# Patient Record
Sex: Male | Born: 1980 | Race: White | Hispanic: No | Marital: Married | Smoking: Never smoker
Health system: Southern US, Community
[De-identification: ages and names within clinical notes are randomized; demographics above are authoritative.]

## PROBLEM LIST (undated history)

## (undated) DIAGNOSIS — R109 Unspecified abdominal pain: Secondary | ICD-10-CM

## (undated) DIAGNOSIS — B019 Varicella without complication: Secondary | ICD-10-CM

## (undated) DIAGNOSIS — Z9189 Other specified personal risk factors, not elsewhere classified: Secondary | ICD-10-CM

## (undated) HISTORY — DX: Varicella without complication: B01.9

## (undated) HISTORY — DX: Unspecified abdominal pain: R10.9

## (undated) HISTORY — PX: APPENDECTOMY: SHX54

## (undated) HISTORY — PX: CHOLECYSTECTOMY: SHX55

## (undated) HISTORY — PX: HERNIA MESH REMOVAL: SHX1745

## (undated) HISTORY — DX: Other specified personal risk factors, not elsewhere classified: Z91.89

---

## 2013-07-08 HISTORY — PX: VASECTOMY: SHX75

## 2014-06-17 ENCOUNTER — Ambulatory Visit (INDEPENDENT_AMBULATORY_CARE_PROVIDER_SITE_OTHER): Payer: BC Managed Care – PPO | Admitting: Medical

## 2014-06-17 ENCOUNTER — Encounter: Payer: Self-pay | Admitting: Medical

## 2014-06-17 VITALS — BP 137/80 | HR 58 | Temp 98.1°F | Wt 200.2 lb

## 2014-06-17 DIAGNOSIS — Z0000 Encounter for general adult medical examination without abnormal findings: Secondary | ICD-10-CM | POA: Insufficient documentation

## 2014-06-17 DIAGNOSIS — Z0189 Encounter for other specified special examinations: Secondary | ICD-10-CM

## 2014-06-17 NOTE — Progress Notes (Signed)
Subjective:    Patient ID: Jeffrey CoxJill Mckinney, male    DOB: 10/17/1980, 33 y.o.   MRN: [id number]  HPI   I have reviewed pt PMH, PSH, FH, Social History and Surgical History.  Pt works on generators, No exercise, maybe drinks a liter of coke a day, eats out a lot, married- 2 children., Non smoker and no alcohol.  Pt is not fasting.   Pt has negative review of systems except about 3 times a year after a meal he will feel very light headed and weak. Then he feels like may pass out and will have to lay down and then it will pass. So he never actually passes out. No seizure type activity. No chest pain that goes along with these episodes. Does  occasionally get heart burn but not with those weak episodes after meals.  Pt states sometimes with severe pain like if he hits his hand with hammer feels like he might pass out.  Mild nasal congested only today  Tdap in 2014.  Past Medical History  Diagnosis Date  . Chicken pox   . History of fainting spells of unknown cause     History   Social History  . Marital Status: Married    Spouse Name: N/A    Number of Children: N/A  . Years of Education: N/A   Occupational History  . Generator Tech    Social History Main Topics  . Smoking status: Never Smoker   . Smokeless tobacco: Not on file  . Alcohol Use: No  . Drug Use: No  . Sexual Activity:    Partners: Female   Other Topics Concern  . Not on file   Social History Narrative    Past Surgical History  Procedure Laterality Date  . Vasectomy  2015    Family History  Problem Relation Age of Onset  . Diabetes Paternal Grandmother   . Hypertension Father     Not on File  No current outpatient prescriptions on file prior to visit.   No current facility-administered medications on file prior to visit.    BP 137/80 mmHg  Pulse 58  Temp(Src) 98.1 F (36.7 C) (Oral)  Wt 200 lb 3.2 oz (90.81 kg)  SpO2 100%      Review of Systems  Constitutional: Negative for fever,  chills and fatigue.  HENT: Positive for congestion. Negative for ear discharge, ear pain, nosebleeds, postnasal drip, rhinorrhea, sinus pressure, sneezing, sore throat and trouble swallowing.   Respiratory: Negative for cough, chest tightness, shortness of breath and wheezing.   Cardiovascular: Negative for chest pain and palpitations.  Gastrointestinal: Negative for nausea, abdominal pain, diarrhea and rectal pain.       Rare heart burn none now.  Musculoskeletal: Negative for back pain.  Neurological: Positive for weakness. Negative for dizziness, tremors, seizures, syncope, facial asymmetry, light-headedness, numbness and headaches.       Rare occasional after eating large meals or if experiences pain.But no syncope.  Not now.  Hematological: Negative for adenopathy. Does not bruise/bleed easily.  Psychiatric/Behavioral: Negative for suicidal ideas, behavioral problems, self-injury and dysphoric mood. The patient is not nervous/anxious.        Objective:   Physical Exam   General Mental Status- Alert. Orientation- Oriented x3.  Build and Nutrition- Well nourished and Well Developed.  Skin General:-Normal. Color- Normal color. Moisture- Normal. Temperature-Warm.  HEENT  Ears- Normal. Auditory Canal- Bilateral-Normal. Tympanic Membrane- Bilateral-Normal. Eye Fundi-Bilateral-Normal. Pupil- bilateral- Direct reaction to light normal. Nose & Sinuses-  Normal. Nostrils-Bilateral- Normal. Mouth & Throat-Normal.  Neck Neck- No Bruits or Masses. Trachea midline.  Thyroid- Normal.  Chest and Lung Exam Percussion: Quality and Intensity-Percussion normal. Percussion of the chest reveals- No Dullness.  Palpation: Palpation of the chest reveals- Non-tender- No dullness. Auscultation: Breath Sounds- Normal.  Adventitous Sounds:-No adventitious sounds.  Cardiovascular Inspection:- No Heaves. Auscultation:-Normal sinus rhythm without murmur gallop, S1 WNL and S2  WNL.  Abdomen Inspection:-Inspection Normal. Inspection of the abdomen reveals- No hernias Palpation/Percussion:- Palpation and Percussion of the Abdomen reveal- Non Tender and No Palpable abdominal masses. Liver: Other Characteristics- No hepatomegaly. Spleen:Other Characteristics- No Splenomegaly. Auscultation:- Auscultation of the abdomen reveals- Bowel sounds normal and No Abdominal bruits.  Male Genitourinary Urethra:- No discharge. Penis- Circumcised. Scrotum- No masses. Testes- Bilateral-Normal.   Peripheral  Vascular Lower Extremity:Inspection- Bilateral-Inspection Normal  Palpation: Femoral pulse- Bilateral- 2+. Popliteal pulse- Bilateral-2+. Dorsalis pedis pulse- Bilateral- 1/2+. Edema- Bilateral- No edema.  Neurologic Mental Status:- Normal. Cranial Nerves:-Normal Bilaterally. Motor:-Normal. Strength:5/5 normal muscle strength-All Muscles. General Assessment of Reflexes: Right Knee-2+. Left Knee- 2+. Coordination-Normal. Gait- Normal.  Meningeal Signs- None.  Musculoskeletal Global Assessment General-Joints show full range of motion without obvious deformity and Normal muscle mass. Strength in upper and lower extremities.  Lymphatics General lymphatics Description- No generalized lymphadenopathy.        Assessment & Plan:

## 2014-06-17 NOTE — Progress Notes (Signed)
Pre visit review using our clinic review tool, if applicable. No additional management support is needed unless otherwise documented below in the visit note. 

## 2014-06-17 NOTE — Assessment & Plan Note (Signed)
For your physical exam today, I want you to get fasting labs within one week.(cmp, cbc, tsh, lipid panel)  I want you to cut back a lot on you soda consumption. Your weak sensation after meals may be related to high blood sugar.

## 2014-06-17 NOTE — Patient Instructions (Addendum)
For your physical exam today, I want you to get fasting labs within one week.  I want you to cut back a lot on you soda consumption. Your weak sensation after meals may be related to high blood sugar.  If you ever experience syncope, you need to be evaluated same day.  On ros you have mild cold/iri. Recommend continue mucinex. If worsens let us know.  Preventive Care for Adults A healthy lifestyle and preventive care can promote health and wellness. Preventive health guidelines for men include the following key practices:  A routine yearly physical is a good way to check with your health care provider about your health and preventative screening. It is a chance to share any concerns and updates on your health and to receive a thorough exam.  Visit your dentist for a routine exam and preventative care every 6 months. Brush your teeth twice a day and floss once a day. Good oral hygiene prevents tooth decay and gum disease.  The frequency of eye exams is based on your age, health, family medical history, use of contact lenses, and other factors. Follow your health care provider's recommendations for frequency of eye exams.  Eat a healthy diet. Foods such as vegetables, fruits, whole grains, low-fat dairy products, and lean protein foods contain the nutrients you need without too many calories. Decrease your intake of foods high in solid fats, added sugars, and salt. Eat the right amount of calories for you.Get information about a proper diet from your health care provider, if necessary.  Regular physical exercise is one of the most important things you can do for your health. Most adults should get at least 150 minutes of moderate-intensity exercise (any activity that increases your heart rate and causes you to sweat) each week. In addition, most adults need muscle-strengthening exercises on 2 or more days a week.  Maintain a healthy weight. The body mass index (BMI) is a screening tool to  identify possible weight problems. It provides an estimate of body fat based on height and weight. Your health care provider can find your BMI and can help you achieve or maintain a healthy weight.For adults 20 years and older:  A BMI below 18.5 is considered underweight.  A BMI of 18.5 to 24.9 is normal.  A BMI of 25 to 29.9 is considered overweight.  A BMI of 30 and above is considered obese.  Maintain normal blood lipids and cholesterol levels by exercising and minimizing your intake of saturated fat. Eat a balanced diet with plenty of fruit and vegetables. Blood tests for lipids and cholesterol should begin at age 82 and be repeated every 5 years. If your lipid or cholesterol levels are high, you are over 50, or you are at high risk for heart disease, you may need your cholesterol levels checked more frequently.Ongoing high lipid and cholesterol levels should be treated with medicines if diet and exercise are not working.  If you smoke, find out from your health care provider how to quit. If you do not use tobacco, do not start.  Lung cancer screening is recommended for adults aged 71-80 years who are at high risk for developing lung cancer because of a history of smoking. A yearly low-dose CT scan of the lungs is recommended for people who have at least a 30-pack-year history of smoking and are a current smoker or have quit within the past 15 years. A pack year of smoking is smoking an average of 1 pack of cigarettes a  day for 1 year (for example: 1 pack a day for 30 years or 2 packs a day for 15 years). Yearly screening should continue until the smoker has stopped smoking for at least 15 years. Yearly screening should be stopped for people who develop a health problem that would prevent them from having lung cancer treatment.  If you choose to drink alcohol, do not have more than 2 drinks per day. One drink is considered to be 12 ounces (355 mL) of beer, 5 ounces (148 mL) of wine, or 1.5  ounces (44 mL) of liquor.  Avoid use of street drugs. Do not share needles with anyone. Ask for help if you need support or instructions about stopping the use of drugs.  High blood pressure causes heart disease and increases the risk of stroke. Your blood pressure should be checked at least every 1-2 years. Ongoing high blood pressure should be treated with medicines, if weight loss and exercise are not effective.  If you are 80-6 years old, ask your health care provider if you should take aspirin to prevent heart disease.  Diabetes screening involves taking a blood sample to check your fasting blood sugar level. This should be done once every 3 years, after age 92, if you are within normal weight and without risk factors for diabetes. Testing should be considered at a younger age or be carried out more frequently if you are overweight and have at least 1 risk factor for diabetes.  Colorectal cancer can be detected and often prevented. Most routine colorectal cancer screening begins at the age of 69 and continues through age 34. However, your health care provider may recommend screening at an earlier age if you have risk factors for colon cancer. On a yearly basis, your health care provider may provide home test kits to check for hidden blood in the stool. Use of a small camera at the end of a tube to directly examine the colon (sigmoidoscopy or colonoscopy) can detect the earliest forms of colorectal cancer. Talk to your health care provider about this at age 76, when routine screening begins. Direct exam of the colon should be repeated every 5-10 years through age 21, unless early forms of precancerous polyps or small growths are found.  People who are at an increased risk for hepatitis B should be screened for this virus. You are considered at high risk for hepatitis B if:  You were born in a country where hepatitis B occurs often. Talk with your health care provider about which countries are  considered high risk.  Your parents were born in a high-risk country and you have not received a shot to protect against hepatitis B (hepatitis B vaccine).  You have HIV or AIDS.  You use needles to inject street drugs.  You live with, or have sex with, someone who has hepatitis B.  You are a man who has sex with other men (MSM).  You get hemodialysis treatment.  You take certain medicines for conditions such as cancer, organ transplantation, and autoimmune conditions.  Hepatitis C blood testing is recommended for all people born from [birth year] through 1965 and any individual with known risks for hepatitis C.  Practice safe sex. Use condoms and avoid high-risk sexual practices to reduce the spread of sexually transmitted infections (STIs). STIs include gonorrhea, chlamydia, syphilis, trichomonas, herpes, HPV, and human immunodeficiency virus (HIV). Herpes, HIV, and HPV are viral illnesses that have no cure. They can result in disability, cancer, and death.  If  you are at risk of being infected with HIV, it is recommended that you take a prescription medicine daily to prevent HIV infection. This is called preexposure prophylaxis (PrEP). You are considered at risk if:  You are a man who has sex with other men (MSM) and have other risk factors.  You are a heterosexual man, are sexually active, and are at increased risk for HIV infection.  You take drugs by injection.  You are sexually active with a partner who has HIV.  Talk with your health care provider about whether you are at high risk of being infected with HIV. If you choose to begin PrEP, you should first be tested for HIV. You should then be tested every 3 months for as long as you are taking PrEP.  A one-time screening for abdominal aortic aneurysm (AAA) and surgical repair of large AAAs by ultrasound are recommended for men ages 31 to 94 years who are current or former smokers.  Healthy men should no longer receive  prostate-specific antigen (PSA) blood tests as part of routine cancer screening. Talk with your health care provider about prostate cancer screening.  Testicular cancer screening is not recommended for adult males who have no symptoms. Screening includes self-exam, a health care provider exam, and other screening tests. Consult with your health care provider about any symptoms you have or any concerns you have about testicular cancer.  Use sunscreen. Apply sunscreen liberally and repeatedly throughout the day. You should seek shade when your shadow is shorter than you. Protect yourself by wearing long sleeves, pants, a wide-brimmed hat, and sunglasses year round, whenever you are outdoors.  Once a month, do a whole-body skin exam, using a mirror to look at the skin on your back. Tell your health care provider about new moles, moles that have irregular borders, moles that are larger than a pencil eraser, or moles that have changed in shape or color.  Stay current with required vaccines (immunizations).  Influenza vaccine. All adults should be immunized every year.  Tetanus, diphtheria, and acellular pertussis (Td, Tdap) vaccine. An adult who has not previously received Tdap or who does not know his vaccine status should receive 1 dose of Tdap. This initial dose should be followed by tetanus and diphtheria toxoids (Td) booster doses every 10 years. Adults with an unknown or incomplete history of completing a 3-dose immunization series with Td-containing vaccines should begin or complete a primary immunization series including a Tdap dose. Adults should receive a Td booster every 10 years.  Varicella vaccine. An adult without evidence of immunity to varicella should receive 2 doses or a second dose if he has previously received 1 dose.  Human papillomavirus (HPV) vaccine. Males aged 22-21 years who have not received the vaccine previously should receive the 3-dose series. Males aged 22-26 years may be  immunized. Immunization is recommended through the age of 71 years for any male who has sex with males and did not get any or all doses earlier. Immunization is recommended for any person with an immunocompromised condition through the age of 48 years if he did not get any or all doses earlier. During the 3-dose series, the second dose should be obtained 4-8 weeks after the first dose. The third dose should be obtained 24 weeks after the first dose and 16 weeks after the second dose.  Zoster vaccine. One dose is recommended for adults aged 20 years or older unless certain conditions are present.  Measles, mumps, and rubella (MMR) vaccine.  Adults born before [birth year] generally are considered immune to measles and mumps. Adults born in [birth year] or later should have 1 or more doses of MMR vaccine unless there is a contraindication to the vaccine or there is laboratory evidence of immunity to each of the three diseases. A routine second dose of MMR vaccine should be obtained at least 28 days after the first dose for students attending postsecondary schools, health care workers, or international travelers. People who received inactivated measles vaccine or an unknown type of measles vaccine during 1963-1967 should receive 2 doses of MMR vaccine. People who received inactivated mumps vaccine or an unknown type of mumps vaccine before 1979 and are at high risk for mumps infection should consider immunization with 2 doses of MMR vaccine. Unvaccinated health care workers born before [birth year] who lack laboratory evidence of measles, mumps, or rubella immunity or laboratory confirmation of disease should consider measles and mumps immunization with 2 doses of MMR vaccine or rubella immunization with 1 dose of MMR vaccine.  Pneumococcal 13-valent conjugate (PCV13) vaccine. When indicated, a person who is uncertain of his immunization history and has no record of immunization should receive the PCV13 vaccine. An adult aged 25 years or  older who has certain medical conditions and has not been previously immunized should receive 1 dose of PCV13 vaccine. This PCV13 should be followed with a dose of pneumococcal polysaccharide (PPSV23) vaccine. The PPSV23 vaccine dose should be obtained at least 8 weeks after the dose of PCV13 vaccine. An adult aged 52 years or older who has certain medical conditions and previously received 1 or more doses of PPSV23 vaccine should receive 1 dose of PCV13. The PCV13 vaccine dose should be obtained 1 or more years after the last PPSV23 vaccine dose.  Pneumococcal polysaccharide (PPSV23) vaccine. When PCV13 is also indicated, PCV13 should be obtained first. All adults aged 54 years and older should be immunized. An adult younger than age 66 years who has certain medical conditions should be immunized. Any person who resides in a nursing home or long-term care facility should be immunized. An adult smoker should be immunized. People with an immunocompromised condition and certain other conditions should receive both PCV13 and PPSV23 vaccines. People with human immunodeficiency virus (HIV) infection should be immunized as soon as possible after diagnosis. Immunization during chemotherapy or radiation therapy should be avoided. Routine use of PPSV23 vaccine is not recommended for American Indians, [place] Natives, or people younger than 65 years unless there are medical conditions that require PPSV23 vaccine. When indicated, people who have unknown immunization and have no record of immunization should receive PPSV23 vaccine. One-time revaccination 5 years after the first dose of PPSV23 is recommended for people aged 19-64 years who have chronic kidney failure, nephrotic syndrome, asplenia, or immunocompromised conditions. People who received 1-2 doses of PPSV23 before age 29 years should receive another dose of PPSV23 vaccine at age 35 years or later if at least 5 years have passed since the previous dose. Doses of  PPSV23 are not needed for people immunized with PPSV23 at or after age 45 years.  Meningococcal vaccine. Adults with asplenia or persistent complement component deficiencies should receive 2 doses of quadrivalent meningococcal conjugate (MenACWY-D) vaccine. The doses should be obtained at least 2 months apart. Microbiologists working with certain meningococcal bacteria, [employer] recruits, people at risk during an outbreak, and people who travel to or live in countries with a high rate of meningitis should be immunized. A first-year college student up through  age 69 years who is living in a residence hall should receive a dose if he did not receive a dose on or after his 16th birthday. Adults who have certain high-risk conditions should receive one or more doses of vaccine.  Hepatitis A vaccine. Adults who wish to be protected from this disease, have certain high-risk conditions, work with hepatitis A-infected animals, work in hepatitis A research labs, or travel to or work in countries with a high rate of hepatitis A should be immunized. Adults who were previously unvaccinated and who anticipate close contact with an international adoptee during the first 60 days after arrival in the [place] from a country with a high rate of hepatitis A should be immunized.  Hepatitis B vaccine. Adults should be immunized if they wish to be protected from this disease, have certain high-risk conditions, may be exposed to blood or other infectious body fluids, are household contacts or sex partners of hepatitis B positive people, are clients or workers in certain care facilities, or travel to or work in countries with a high rate of hepatitis B.  Haemophilus influenzae type b (Hib) vaccine. A previously unvaccinated person with asplenia or sickle cell disease or having a scheduled splenectomy should receive 1 dose of Hib vaccine. Regardless of previous immunization, a recipient of a hematopoietic stem cell transplant  should receive a 3-dose series 6-12 months after his successful transplant. Hib vaccine is not recommended for adults with HIV infection. Preventive Service / Frequency Ages 13 to 45  Blood pressure check.** / Every 1 to 2 years.  Lipid and cholesterol check.** / Every 5 years beginning at age 62.  Hepatitis C blood test.** / For any individual with known risks for hepatitis C.  Skin self-exam. / Monthly.  Influenza vaccine. / Every year.  Tetanus, diphtheria, and acellular pertussis (Tdap, Td) vaccine.** / Consult your health care provider. 1 dose of Td every 10 years.  Varicella vaccine.** / Consult your health care provider.  HPV vaccine. / 3 doses over 6 months, if 29 or younger.  Measles, mumps, rubella (MMR) vaccine.** / You need at least 1 dose of MMR if you were born in 1957 or later. You may also need a second dose.  Pneumococcal 13-valent conjugate (PCV13) vaccine.** / Consult your health care provider.  Pneumococcal polysaccharide (PPSV23) vaccine.** / 1 to 2 doses if you smoke cigarettes or if you have certain conditions.  Meningococcal vaccine.** / 1 dose if you are age 64 to 32 years and a Market researcher living in a residence hall, or have one of several medical conditions. You may also need additional booster doses.  Hepatitis A vaccine.** / Consult your health care provider.  Hepatitis B vaccine.** / Consult your health care provider.  Haemophilus influenzae type b (Hib) vaccine.** / Consult your health care provider. Ages 45 to 33  Blood pressure check.** / Every 1 to 2 years.  Lipid and cholesterol check.** / Every 5 years beginning at age 59.  Lung cancer screening. / Every year if you are aged 62-80 years and have a 30-pack-year history of smoking and currently smoke or have quit within the past 15 years. Yearly screening is stopped once you have quit smoking for at least 15 years or develop a health problem that would prevent you from having  lung cancer treatment.  Fecal occult blood test (FOBT) of stool. / Every year beginning at age 94 and continuing until age 40. You may not have to do this test  if you get a colonoscopy every 10 years.  Flexible sigmoidoscopy** or colonoscopy.** / Every 5 years for a flexible sigmoidoscopy or every 10 years for a colonoscopy beginning at age 104 and continuing until age 49.  Hepatitis C blood test.** / For all people born from [birth year] through 1963 and any individual with known risks for hepatitis C.  Skin self-exam. / Monthly.  Influenza vaccine. / Every year.  Tetanus, diphtheria, and acellular pertussis (Tdap/Td) vaccine.** / Consult your health care provider. 1 dose of Td every 10 years.  Varicella vaccine.** / Consult your health care provider.  Zoster vaccine.** / 1 dose for adults aged 60 years or older.  Measles, mumps, rubella (MMR) vaccine.** / You need at least 1 dose of MMR if you were born in 1955 or later. You may also need a second dose.  Pneumococcal 13-valent conjugate (PCV13) vaccine.** / Consult your health care provider.  Pneumococcal polysaccharide (PPSV23) vaccine.** / 1 to 2 doses if you smoke cigarettes or if you have certain conditions.  Meningococcal vaccine.** / Consult your health care provider.  Hepatitis A vaccine.** / Consult your health care provider.  Hepatitis B vaccine.** / Consult your health care provider.  Haemophilus influenzae type b (Hib) vaccine.** / Consult your health care provider. Ages 68 and over  Blood pressure check.** / Every 1 to 2 years.  Lipid and cholesterol check.**/ Every 5 years beginning at age 65.  Lung cancer screening. / Every year if you are aged 69-80 years and have a 30-pack-year history of smoking and currently smoke or have quit within the past 15 years. Yearly screening is stopped once you have quit smoking for at least 15 years or develop a health problem that would prevent you from having lung cancer  treatment.  Fecal occult blood test (FOBT) of stool. / Every year beginning at age 62 and continuing until age 56. You may not have to do this test if you get a colonoscopy every 10 years.  Flexible sigmoidoscopy** or colonoscopy.** / Every 5 years for a flexible sigmoidoscopy or every 10 years for a colonoscopy beginning at age 77 and continuing until age 61.  Hepatitis C blood test.** / For all people born from [birth year] through 1963 and any individual with known risks for hepatitis C.  Abdominal aortic aneurysm (AAA) screening.** / A one-time screening for ages 29 to 33 years who are current or former smokers.  Skin self-exam. / Monthly.  Influenza vaccine. / Every year.  Tetanus, diphtheria, and acellular pertussis (Tdap/Td) vaccine.** / 1 dose of Td every 10 years.  Varicella vaccine.** / Consult your health care provider.  Zoster vaccine.** / 1 dose for adults aged 19 years or older.  Pneumococcal 13-valent conjugate (PCV13) vaccine.** / Consult your health care provider.  Pneumococcal polysaccharide (PPSV23) vaccine.** / 1 dose for all adults aged 7 years and older.  Meningococcal vaccine.** / Consult your health care provider.  Hepatitis A vaccine.** / Consult your health care provider.  Hepatitis B vaccine.** / Consult your health care provider.  Haemophilus influenzae type b (Hib) vaccine.** / Consult your health care provider. **Family history and personal history of risk and conditions may change your health care provider's recommendations. Document Released: 08/20/2001 Document Revised: 06/29/2013 Document Reviewed: 11/19/2010 [facility] Patient Information 2015 [place], [place]. This information is not intended to replace advice given to you by your health care provider. Make sure you discuss any questions you have with your health care provider.

## 2014-06-22 ENCOUNTER — Telehealth: Payer: Self-pay | Admitting: Medical

## 2014-06-22 ENCOUNTER — Other Ambulatory Visit (INDEPENDENT_AMBULATORY_CARE_PROVIDER_SITE_OTHER): Payer: BC Managed Care – PPO

## 2014-06-22 DIAGNOSIS — Z0000 Encounter for general adult medical examination without abnormal findings: Secondary | ICD-10-CM

## 2014-06-22 DIAGNOSIS — Z0189 Encounter for other specified special examinations: Secondary | ICD-10-CM

## 2014-06-22 DIAGNOSIS — E875 Hyperkalemia: Secondary | ICD-10-CM

## 2014-06-22 LAB — CBC WITH DIFFERENTIAL/PLATELET
BASOS ABS: 0 10*3/uL (ref 0.0–0.1)
Basophils Relative: 0.6 % (ref 0.0–3.0)
Eosinophils Absolute: 0.3 10*3/uL (ref 0.0–0.7)
Eosinophils Relative: 4.5 % (ref 0.0–5.0)
HCT: 48.9 % (ref 39.0–52.0)
Hemoglobin: 16 g/dL (ref 13.0–17.0)
LYMPHS PCT: 39.7 % (ref 12.0–46.0)
Lymphs Abs: 3.1 10*3/uL (ref 0.7–4.0)
MCHC: 32.6 g/dL (ref 30.0–36.0)
MCV: 85.7 fl (ref 78.0–100.0)
Monocytes Absolute: 0.6 10*3/uL (ref 0.1–1.0)
Monocytes Relative: 7.8 % (ref 3.0–12.0)
NEUTROS PCT: 47.4 % (ref 43.0–77.0)
Neutro Abs: 3.7 10*3/uL (ref 1.4–7.7)
Platelets: 285 10*3/uL (ref 150.0–400.0)
RBC: 5.71 Mil/uL (ref 4.22–5.81)
RDW: 13.5 % (ref 11.5–15.5)
WBC: 7.7 10*3/uL (ref 4.0–10.5)

## 2014-06-22 LAB — LIPID PANEL
CHOL/HDL RATIO: 6
Cholesterol: 172 mg/dL (ref 0–200)
HDL: 29.1 mg/dL — ABNORMAL LOW (ref >39.00)
LDL Cholesterol: 116 mg/dL — ABNORMAL HIGH (ref 0–99)
NONHDL: 142.9
Triglycerides: 137 mg/dL (ref 0.0–149.0)
VLDL: 27.4 mg/dL (ref 0.0–40.0)

## 2014-06-22 LAB — COMPREHENSIVE METABOLIC PANEL
ALBUMIN: 4.9 g/dL (ref 3.5–5.2)
ALT: 50 U/L (ref 0–53)
AST: 28 U/L (ref 0–37)
Alkaline Phosphatase: 76 U/L (ref 39–117)
BUN: 13 mg/dL (ref 6–23)
CALCIUM: 9.9 mg/dL (ref 8.4–10.5)
CHLORIDE: 105 meq/L (ref 96–112)
CO2: 26 mEq/L (ref 19–32)
Creatinine, Ser: 1.1 mg/dL (ref 0.4–1.5)
GFR: 86.31 mL/min (ref >60.00)
Glucose, Bld: 95 mg/dL (ref 70–99)
Potassium: 5.3 mEq/L — ABNORMAL HIGH (ref 3.5–5.1)
Sodium: 140 mEq/L (ref 135–145)
Total Bilirubin: 0.6 mg/dL (ref 0.2–1.2)
Total Protein: 8.1 g/dL (ref 6.0–8.3)

## 2014-06-22 LAB — TSH: TSH: 1.17 u[IU]/mL (ref 0.35–4.50)

## 2014-06-22 NOTE — Telephone Encounter (Signed)
Hyperkalemia. Place order in computer so pt can repeat cmp in 2 wks.

## 2014-06-23 ENCOUNTER — Other Ambulatory Visit: Payer: Self-pay | Admitting: Medical

## 2014-06-23 DIAGNOSIS — E875 Hyperkalemia: Secondary | ICD-10-CM

## 2014-06-23 NOTE — Telephone Encounter (Addendum)
Order placed in system. Patient scheduled for  Future CMP.

## 2014-06-24 ENCOUNTER — Other Ambulatory Visit: Payer: BC Managed Care – PPO

## 2014-07-15 ENCOUNTER — Other Ambulatory Visit (INDEPENDENT_AMBULATORY_CARE_PROVIDER_SITE_OTHER): Payer: BLUE CROSS/BLUE SHIELD

## 2014-07-15 DIAGNOSIS — E875 Hyperkalemia: Secondary | ICD-10-CM

## 2014-07-15 LAB — COMPREHENSIVE METABOLIC PANEL
ALBUMIN: 4.8 g/dL (ref 3.5–5.2)
ALK PHOS: 70 U/L (ref 39–117)
ALT: 39 U/L (ref 0–53)
AST: 25 U/L (ref 0–37)
BILIRUBIN TOTAL: 0.3 mg/dL (ref 0.2–1.2)
BUN: 10 mg/dL (ref 6–23)
CALCIUM: 9.4 mg/dL (ref 8.4–10.5)
CO2: 28 meq/L (ref 19–32)
Chloride: 107 mEq/L (ref 96–112)
Creatinine, Ser: 1.1 mg/dL (ref 0.4–1.5)
GFR: 80.14 mL/min (ref >60.00)
Glucose, Bld: 101 mg/dL — ABNORMAL HIGH (ref 70–99)
Potassium: 3.9 mEq/L (ref 3.5–5.1)
SODIUM: 142 meq/L (ref 135–145)
Total Protein: 7.9 g/dL (ref 6.0–8.3)

## 2014-07-15 LAB — COMPLETE METABOLIC PANEL WITH GFR
ALBUMIN: 4.8 g/dL (ref 3.5–5.2)
ALT: 35 U/L (ref 0–53)
AST: 22 U/L (ref 0–37)
Alkaline Phosphatase: 68 U/L (ref 39–117)
BUN: 10 mg/dL (ref 6–23)
CHLORIDE: 104 meq/L (ref 96–112)
CO2: 29 meq/L (ref 19–32)
Calcium: 9.7 mg/dL (ref 8.4–10.5)
Creat: 1.07 mg/dL (ref 0.50–1.35)
GFR, Est African American: >89 mL/min
Glucose, Bld: 99 mg/dL (ref 70–99)
Potassium: 5.2 mEq/L (ref 3.5–5.3)
Sodium: 140 mEq/L (ref 135–145)
TOTAL PROTEIN: 7.5 g/dL (ref 6.0–8.3)
Total Bilirubin: 0.3 mg/dL (ref 0.2–1.2)

## 2014-07-22 ENCOUNTER — Other Ambulatory Visit (INDEPENDENT_AMBULATORY_CARE_PROVIDER_SITE_OTHER): Payer: BLUE CROSS/BLUE SHIELD

## 2014-07-22 DIAGNOSIS — E875 Hyperkalemia: Secondary | ICD-10-CM

## 2014-07-22 LAB — COMPREHENSIVE METABOLIC PANEL
ALK PHOS: 67 U/L (ref 39–117)
ALT: 35 U/L (ref 0–53)
AST: 24 U/L (ref 0–37)
Albumin: 4.7 g/dL (ref 3.5–5.2)
BILIRUBIN TOTAL: 0.5 mg/dL (ref 0.2–1.2)
BUN: 11 mg/dL (ref 6–23)
CHLORIDE: 103 meq/L (ref 96–112)
CO2: 30 mEq/L (ref 19–32)
Calcium: 9.8 mg/dL (ref 8.4–10.5)
Creatinine, Ser: 1.07 mg/dL (ref 0.40–1.50)
GFR: 85.34 mL/min (ref >60.00)
GLUCOSE: 102 mg/dL — AB (ref 70–99)
Potassium: 4.4 mEq/L (ref 3.5–5.1)
SODIUM: 138 meq/L (ref 135–145)
TOTAL PROTEIN: 7.9 g/dL (ref 6.0–8.3)

## 2016-02-15 ENCOUNTER — Ambulatory Visit (INDEPENDENT_AMBULATORY_CARE_PROVIDER_SITE_OTHER): Payer: BLUE CROSS/BLUE SHIELD | Admitting: Medical

## 2016-02-15 ENCOUNTER — Ambulatory Visit (HOSPITAL_BASED_OUTPATIENT_CLINIC_OR_DEPARTMENT_OTHER)
Admission: RE | Admit: 2016-02-15 | Discharge: 2016-02-15 | Disposition: A | Discharge status: Home / Self Care | Payer: BLUE CROSS/BLUE SHIELD | Source: Ambulatory Visit | Attending: Medical | Admitting: Medical

## 2016-02-15 ENCOUNTER — Encounter: Payer: Self-pay | Admitting: Medical

## 2016-02-15 VITALS — BP 120/62 | HR 57 | Temp 97.8°F | Ht 72.0 in | Wt 191.4 lb

## 2016-02-15 DIAGNOSIS — K5900 Constipation, unspecified: Secondary | ICD-10-CM | POA: Insufficient documentation

## 2016-02-15 DIAGNOSIS — K4090 Unilateral inguinal hernia, without obstruction or gangrene, not specified as recurrent: Secondary | ICD-10-CM

## 2016-02-15 DIAGNOSIS — R1084 Generalized abdominal pain: Secondary | ICD-10-CM

## 2016-02-15 DIAGNOSIS — K589 Irritable bowel syndrome without diarrhea: Secondary | ICD-10-CM

## 2016-02-15 LAB — CBC WITH DIFFERENTIAL/PLATELET
BASOS PCT: 0.6 % (ref 0.0–3.0)
Basophils Absolute: 0 10*3/uL (ref 0.0–0.1)
EOS ABS: 0.2 10*3/uL (ref 0.0–0.7)
Eosinophils Relative: 2.8 % (ref 0.0–5.0)
HCT: 45.2 % (ref 39.0–52.0)
HEMOGLOBIN: 15.4 g/dL (ref 13.0–17.0)
Lymphocytes Relative: 33.5 % (ref 12.0–46.0)
Lymphs Abs: 2.2 10*3/uL (ref 0.7–4.0)
MCHC: 34 g/dL (ref 30.0–36.0)
MCV: 82.9 fl (ref 78.0–100.0)
MONO ABS: 0.6 10*3/uL (ref 0.1–1.0)
Monocytes Relative: 9.3 % (ref 3.0–12.0)
Neutro Abs: 3.5 10*3/uL (ref 1.4–7.7)
Neutrophils Relative %: 53.8 % (ref 43.0–77.0)
Platelets: 248 10*3/uL (ref 150.0–400.0)
RBC: 5.45 Mil/uL (ref 4.22–5.81)
RDW: 13.7 % (ref 11.5–15.5)
WBC: 6.5 10*3/uL (ref 4.0–10.5)

## 2016-02-15 LAB — COMPREHENSIVE METABOLIC PANEL
ALT: 21 U/L (ref 0–53)
AST: 16 U/L (ref 0–37)
Albumin: 5 g/dL (ref 3.5–5.2)
Alkaline Phosphatase: 64 U/L (ref 39–117)
BUN: 11 mg/dL (ref 6–23)
CALCIUM: 9.8 mg/dL (ref 8.4–10.5)
CHLORIDE: 103 meq/L (ref 96–112)
CO2: 28 mEq/L (ref 19–32)
CREATININE: 1.04 mg/dL (ref 0.40–1.50)
GFR: 87.32 mL/min (ref >60.00)
Glucose, Bld: 96 mg/dL (ref 70–99)
POTASSIUM: 3.3 meq/L — AB (ref 3.5–5.1)
Sodium: 138 mEq/L (ref 135–145)
Total Bilirubin: 0.6 mg/dL (ref 0.2–1.2)
Total Protein: 7.8 g/dL (ref 6.0–8.3)

## 2016-02-15 NOTE — Patient Instructions (Addendum)
For your intermittent abdominal pain with history of constipation will recommend metamucil 1 tablespoon  In 8 0z of water 3 times daily. Also can get probiotic otc. Hydrate well. Eat more fruits and vegetables.  Since no bm in 3 days will get abd xray. Will recommend miralax otc. See if bm by tomorrow.  Ifob test ordered.  If you get loose stool/diarrhea pattern again in future then stool panel studies.  Will get cbc, cmp and h pylori studies today.  Refer to general surgeon for rt inguinal hernia.  Also may need to refer to GI if your symptoms persist.  Follow up in 2 weeks or as needed

## 2016-02-15 NOTE — Progress Notes (Signed)
Pre visit review using our clinic review tool, if applicable. No additional management support is needed unless otherwise documented below in the visit note. 

## 2016-02-15 NOTE — Progress Notes (Signed)
Subjective:    Patient ID: Jeffrey Mckinney, male    DOB: [birth year], 35 y.o.   MRN: [id number]  HPI   Pt in for some abdominal pain. Pt states about 8 weeks he states he felt like he had food poisonings. He had 7 days of looses stools. This eventually did improve but he states some intermittent constipation at times. Describes gasy sensation at times. He states over past 5-6 weeks he has bm every 2-3 days. Pt states his diet has improved some. He is cutting out junk food and avoids processed food. Pt not eating any fruits and vegetables. Pt states he is afraid to eat fruits and vegetables. He thinks will induce bm. With his work he is on road and if has to go quickly to restroom would be inconvenient.  He also mentions area in rt groin that is painful intermittent. Some burning type pain intermittently. This has been present since June. Less painful now and less frequent. Last time this area hurt was yesterday.   Review of Systems  Constitutional: Negative for chills, fatigue and fever.  HENT: Negative for congestion.   Respiratory: Negative for cough, shortness of breath and wheezing.   Gastrointestinal: Negative for abdominal distention, abdominal pain, blood in stool, constipation, diarrhea, nausea, rectal pain and vomiting.       Rt lower quadrant/ groin area pain at times. See hpi.  Genitourinary: Negative for difficulty urinating, flank pain, frequency and urgency.  Musculoskeletal: Negative for back pain.  Skin: Negative for pallor and rash.  Neurological: Negative for dizziness, speech difficulty, weakness, numbness and headaches.  Hematological: Negative for adenopathy. Does not bruise/bleed easily.  Psychiatric/Behavioral: Negative for behavioral problems and decreased concentration.    Past Medical History:  Diagnosis Date  . Chicken pox   . History of fainting spells of unknown cause      Social History   Social History  . Marital status: Married    Spouse name: N/A  .  Number of children: N/A  . Years of education: N/A   Occupational History  . Generator Tech    Social History Main Topics  . Smoking status: Never Smoker  . Smokeless tobacco: Not on file  . Alcohol use No  . Drug use: No  . Sexual activity: Yes    Partners: Female   Other Topics Concern  . Not on file   Social History Narrative  . No narrative on file    Past Surgical History:  Procedure Laterality Date  . VASECTOMY  2015    Family History  Problem Relation Age of Onset  . Diabetes Paternal Grandmother   . Hypertension Father     Allergies  Allergen Reactions  . Penicillins Other (See Comments)    Unknown reaction    No current outpatient prescriptions on file prior to visit.   No current facility-administered medications on file prior to visit.     BP 120/62   Pulse (!) 57   Temp 97.8 F (36.6 C) (Oral)   Ht 6' (1.829 m)   Wt 191 lb 6.4 oz (86.8 kg)   SpO2 99%   BMI 25.96 kg/m       Objective:   Physical Exam   General Appearance- Not in acute distress.  HEENT Eyes- Scleraeral/Conjuntiva-bilat- Not Yellow. Mouth & Throat- Normal.  Chest and Lung Exam Auscultation: Breath sounds:-Normal. Adventitious sounds:- No Adventitious sounds.  Cardiovascular Auscultation:Rythm - Regular. Heart Sounds -Normal heart sounds.  Abdomen Inspection:-Inspection Normal.  Palpation/Perucssion: Palpation and Percussion  of the abdomen reveal- Non Tender, No Rebound tenderness, No rigidity(Guarding) and No Palpable abdominal masses.  Liver:-Normal.  Spleen:- Normal.  No rt side lower quadrant pain on palpation. No heel jar pain  Groin- area- rt side hernia felt. Moderate size.       Assessment & Plan:  For your intermittent abdominal pain with history of constipation will recommend metamucil 1 tablespoon  In 8 0z of water 3 times daily. Also can get probiotic otc. Hydrate well. Eat more fruits and vegetables.  Since no bm in 3 days will get abd xray.  Will recommend miralax otc. See if bm by tomorrow.  Ifob test ordered.  If you get loose stool/diarrhea pattern again in future then stool panel studies.  Will get cbc, cmp and h pylori studies today.  Refer to general surgeon for rt inguinal hernia.  Also may need to refer to GI if your symptoms persist.  Follow up in 2 weeks or as needed  Priyana Mccarey, Ramon DredgeEdward, VF CorporationPA-C

## 2016-02-16 LAB — H. PYLORI BREATH TEST: H. pylori Breath Test: NOT DETECTED

## 2016-02-19 NOTE — Progress Notes (Signed)
Pt has seen results on MyChart and message also sent for patient to call back if any questions.

## 2016-02-23 ENCOUNTER — Other Ambulatory Visit: Payer: Self-pay | Admitting: Surgery

## 2016-02-23 DIAGNOSIS — R1011 Right upper quadrant pain: Secondary | ICD-10-CM

## 2016-03-01 ENCOUNTER — Encounter: Payer: Self-pay | Admitting: Medical

## 2016-03-01 ENCOUNTER — Ambulatory Visit (INDEPENDENT_AMBULATORY_CARE_PROVIDER_SITE_OTHER): Payer: BLUE CROSS/BLUE SHIELD | Admitting: Medical

## 2016-03-01 VITALS — BP 110/73 | HR 60 | Temp 97.8°F | Ht 72.0 in | Wt 187.2 lb

## 2016-03-01 DIAGNOSIS — K4090 Unilateral inguinal hernia, without obstruction or gangrene, not specified as recurrent: Secondary | ICD-10-CM | POA: Diagnosis not present

## 2016-03-01 DIAGNOSIS — R109 Unspecified abdominal pain: Secondary | ICD-10-CM

## 2016-03-01 DIAGNOSIS — E876 Hypokalemia: Secondary | ICD-10-CM | POA: Diagnosis not present

## 2016-03-01 LAB — COMPREHENSIVE METABOLIC PANEL
ALK PHOS: 66 U/L (ref 39–117)
ALT: 24 U/L (ref 0–53)
AST: 19 U/L (ref 0–37)
Albumin: 5 g/dL (ref 3.5–5.2)
BILIRUBIN TOTAL: 0.6 mg/dL (ref 0.2–1.2)
BUN: 11 mg/dL (ref 6–23)
CO2: 30 meq/L (ref 19–32)
Calcium: 8.8 mg/dL (ref 8.4–10.5)
Chloride: 101 mEq/L (ref 96–112)
Creatinine, Ser: 1.11 mg/dL (ref 0.40–1.50)
GFR: 80.98 mL/min (ref >60.00)
GLUCOSE: 92 mg/dL (ref 70–99)
Potassium: 4 mEq/L (ref 3.5–5.1)
SODIUM: 138 meq/L (ref 135–145)
TOTAL PROTEIN: 7.8 g/dL (ref 6.0–8.3)

## 2016-03-01 NOTE — Patient Instructions (Addendum)
For your history of low k, we will get cmp.   Follow up with surgeon for your hernia repair  Let us know results of abdomen US/if you have gallbladder disease.   Would recommend starting metamucil  1 rounded tablespoon in 8 oz water 3 times daily. See if this helps your intermittent varying pattern of loose stools and constipation. If US negative and you don't improve with metamucil will at that point consider referral to GI.  Follow up date to be determined after labs back and after we know US results. As need follow up well

## 2016-03-01 NOTE — Progress Notes (Signed)
Subjective:    Patient ID: Jeffrey Mckinney, male    DOB: 11/11/1980, 35 y.o.   MRN: [id number]  HPI  Pt in for evaluation.   Pt has seen general surgeon for inguinal hernia evaluation. Surgery for hernia is pending. Surgeon is thinking he may have gallbladder disease as well. He has some random pain rt upper quadrant but not after every meal.(then states random ruq pain at times not meal associated). He stats maybe random mild reflux(very rare at best)  Pt has US of abdomen on Wednesday. If GB disease then he may have gallbladder remove and hernia repair on same date.  On lab work done last time for his abdomen complaint cmp done but no lft elevation. His wbc were normal.  Pt k was low on last visit. He sweats a lot at work. Pt has been eating more banana and other foods with more k.  Pt had some symptoms of constipation. I recommended metamucil and he used probiotic. He thought probiotic made his stomach upset.   Now some recent loose stools reported. Pt never used metamucil since last visit.      Review of Systems  Constitutional: Negative for chills, fatigue and fever.  Respiratory: Negative for cough, choking and wheezing.   Cardiovascular: Negative for chest pain and palpitations.  Gastrointestinal: Positive for abdominal pain. Negative for blood in stool, constipation and diarrhea.       Loose stools at times but not daily. And hx of constipation reported last time.  Rt side hernia stable no increased pain or size.  Musculoskeletal: Negative for back pain.  Skin: Negative for rash.  Hematological: Negative for adenopathy. Does not bruise/bleed easily.  Psychiatric/Behavioral: Negative for behavioral problems and confusion.    Past Medical History:  Diagnosis Date  . Chicken pox   . History of fainting spells of unknown cause      Social History   Social History  . Marital status: Married    Spouse name: N/A  . Number of children: N/A  . Years of education: N/A    Occupational History  . Generator Tech    Social History Main Topics  . Smoking status: Never Smoker  . Smokeless tobacco: Not on file  . Alcohol use No  . Drug use: No  . Sexual activity: Yes    Partners: Female   Other Topics Concern  . Not on file   Social History Narrative  . No narrative on file    Past Surgical History:  Procedure Laterality Date  . VASECTOMY  2015    Family History  Problem Relation Age of Onset  . Diabetes Paternal Grandmother   . Hypertension Father     Allergies  Allergen Reactions  . Penicillins Other (See Comments)    Unknown reaction    No current outpatient prescriptions on file prior to visit.   No current facility-administered medications on file prior to visit.     BP 110/73   Pulse 60   Temp 97.8 F (36.6 C) (Oral)   Ht 6' (1.829 m)   Wt 187 lb 3.2 oz (84.9 kg)   SpO2 99%   BMI 25.39 kg/m      Objective:   Physical Exam  General Appearance- Not in acute distress.  HEENT Eyes- Scleraeral/Conjuntiva-bilat- Not Yellow. Mouth & Throat- Normal.  Chest and Lung Exam Auscultation: Breath sounds:-Normal. Adventitious sounds:- No Adventitious sounds.  Cardiovascular Auscultation:Rythm - Regular. Heart Sounds -Normal heart sounds.  Abdomen Inspection:-Inspection Normal.  Palpation/Perucssion: Palpation  and Percussion of the abdomen reveal- Non Tender, No Rebound tenderness, No rigidity(Guarding) and No Palpable abdominal masses.  Liver:-Normal.  Spleen:- Normal.   Back- no cva tenderness.      Assessment & Plan:  For your history of low k, we will get cmp.   Follow up with surgeon for your hernia repair  Let us know results of abdomen US/if you have gallbladder disease.   Would recommend starting metamucil  1 rounded tablespoon in 8 oz water 3 times daily. See if this helps your intermittent varying pattern of loose stools and constipation. If [place] negative and you don't improve with metamucil will at that  point consider referral to GI.  Follow up date to be determined after labs back and after we know [place] results. As need follow up well   Esperanza Richters, PA-C

## 2016-03-01 NOTE — Progress Notes (Signed)
Pre visit review using our clinic tool,if applicable. No additional management support is needed unless otherwise documented below in the visit note.  

## 2016-03-06 ENCOUNTER — Ambulatory Visit
Admission: RE | Admit: 2016-03-06 | Discharge: 2016-03-06 | Disposition: A | Discharge status: Home / Self Care | Payer: BLUE CROSS/BLUE SHIELD | Source: Ambulatory Visit | Attending: Surgery | Admitting: Surgery

## 2016-03-06 DIAGNOSIS — R1011 Right upper quadrant pain: Secondary | ICD-10-CM

## 2016-03-25 ENCOUNTER — Ambulatory Visit: Payer: Self-pay | Admitting: Surgery

## 2016-03-25 NOTE — H&P (Signed)
History of Present Illness Jeffrey Mckinney. Jeffrey Patti MD; 03/25/2016 9:23 AM) Patient words: discuss [place].  The patient is a 35 year old male presenting to discuss diagnostic procedure results. This is a 35 year old male in good health who presents with a two-month history of some discomfort and swelling in his right groin. He does not remember a single initiating event that he has developed some fullness in this area. When he is on his feet for any significant length of time this becomes fairly uncomfortable. It feels better if he is able to sit down or lay down. He denies any erythema over this area.  Over the last several months the patient has also had some increasing frequency of some digestive symptoms. The patient travels a lot for work and tends to eat out a lot. He describes frequent postprandial abdominal distention, upper abdominal pain, and diarrhea. He also has some mild nausea. He has tried to change his diet to eat less greasy food. He reports more flatus and belching than previously. At the last visit I examined him and noted the right inguinal hernia. We were also suspicious for gallbladder disease. He obtained an ultrasound that showed gallbladder sludge. The patient reports more symptoms when he tries to eat. He is beginning to have some definite right upper quadrant abdominal pain after eating.  CLINICAL DATA: 35 year old male with postprandial upper abdominal pain nausea and diarrhea for 4 months. Prior appendectomy. Initial encounter.  EXAM: ABDOMEN ULTRASOUND COMPLETE  COMPARISON: [facility] CT Abdomen and Pelvis 07/01/2003  FINDINGS: Gallbladder: Normal gallbladder wall thickness of 1-2 mm. No pericholecystic fluid. No sonographic Murphy sign elicited. Incidental Phrygian cap (normal variant). On upright views of the gallbladder there is a small volume of sludge suspected in the gallbladder neck (image 79). No gallstones identified.  Common bile duct:  Diameter: 4 mm, normal  Liver: No focal lesion identified. Within normal limits in parenchymal echogenicity.  IVC: No abnormality visualized.  Pancreas: Visualized portion unremarkable.  Spleen: Size and appearance within normal limits.  Right Kidney: Length: 10.4 cm. Incidental 11 mm simple appearing lower pole cyst (image 50). Echogenicity within normal limits. No mass or hydronephrosis visualized.  Left Kidney: Length: 11 cm. Echogenicity within normal limits. No mass or hydronephrosis visualized.  Abdominal aorta: No aneurysm visualized.  Other findings: None.  IMPRESSION: 1. Small volume of gallbladder sludge suspected, but definite cholelithiasis and no evidence of acute cholecystitis or biliary obstruction. 2. Otherwise negative abdomen ultrasound.   Electronically Signed By: Odessa Fleming M.D. On: 03/06/2016 08:35   Allergies (Ammie Eversole, LPN; 0/98/1191 4:78 AM) No Known Drug Allergies 02/23/2016  Medication History (Ammie Eversole, LPN; 2/95/6213 0:86 AM) No Current Medications Medications Reconciled    Vitals (Ammie Eversole LPN; 5/78/4696 2:95 AM) 03/25/2016 8:48 AM Weight: 187.6 lb Height: 72in Body Surface Area: 2.07 m Body Mass Index: 25.44 kg/m  Temp.: 98.4F(Oral)  Pulse: 70 (Regular)  BP: 138/76 (Sitting, Left Arm, Standard)      Physical Exam Molli Hazard K. Dayne Dekay MD; 03/25/2016 9:24 AM)  The physical exam findings are as follows: Note:WDWN in NAD HEENT: EOMI, sclera anicteric Neck: No masses, no thyromegaly Lungs: CTA bilaterally; normal respiratory effort CV: Regular rate and rhythm; no murmurs Abd: +bowel sounds, soft, mild RUQ tenderness, no masses GU: Bilateral descended testes, no testicular masses Visible reducible right inguinal bulge No sign of left inguinal hernia Ext: Well-perfused; no edema Skin: Warm, dry; no sign of jaundice    Assessment & Plan Molli Hazard K. Ame Heagle MD; 03/25/2016 9:08  AM)  RIGHT  INGUINAL HERNIA (K40.90)  CHRONIC CHOLECYSTITIS (K81.1)  Current Plans Schedule for Surgery - Laparoscopic cholecystectomy with intraoperative cholangiogram/ right inguinal hernia repair with mesh. The surgical procedure has been discussed with the patient. Potential risks, benefits, alternative treatments, and expected outcomes have been explained. All of the patient's questions at this time have been answered. The likelihood of reaching the patient's treatment goal is good. The patient understand the proposed surgical procedure and wishes to proceed.  Jeffrey ArmsMatthew K. Corliss Skainssuei, MD, [facility]  General/ Trauma Surgery  03/25/2016 9:24 AM

## 2016-04-07 ENCOUNTER — Encounter: Payer: Self-pay | Admitting: Medical

## 2016-08-22 ENCOUNTER — Encounter: Payer: Self-pay | Admitting: Medical

## 2016-09-13 ENCOUNTER — Other Ambulatory Visit (HOSPITAL_BASED_OUTPATIENT_CLINIC_OR_DEPARTMENT_OTHER): Payer: BLUE CROSS/BLUE SHIELD

## 2016-09-13 ENCOUNTER — Encounter: Payer: Self-pay | Admitting: Medical

## 2016-09-13 ENCOUNTER — Ambulatory Visit (INDEPENDENT_AMBULATORY_CARE_PROVIDER_SITE_OTHER): Payer: BLUE CROSS/BLUE SHIELD | Admitting: Medical

## 2016-09-13 VITALS — BP 122/76 | HR 57 | Temp 97.6°F | Resp 16 | Ht 72.0 in | Wt 186.5 lb

## 2016-09-13 DIAGNOSIS — R350 Frequency of micturition: Secondary | ICD-10-CM

## 2016-09-13 DIAGNOSIS — R1031 Right lower quadrant pain: Secondary | ICD-10-CM

## 2016-09-13 DIAGNOSIS — N50811 Right testicular pain: Secondary | ICD-10-CM | POA: Diagnosis not present

## 2016-09-13 DIAGNOSIS — R319 Hematuria, unspecified: Secondary | ICD-10-CM

## 2016-09-13 LAB — CBC WITH DIFFERENTIAL/PLATELET
BASOS PCT: 0.9 % (ref 0.0–3.0)
Basophils Absolute: 0.1 10*3/uL (ref 0.0–0.1)
EOS PCT: 3.2 % (ref 0.0–5.0)
Eosinophils Absolute: 0.2 10*3/uL (ref 0.0–0.7)
HCT: 45 % (ref 39.0–52.0)
Hemoglobin: 15.4 g/dL (ref 13.0–17.0)
LYMPHS ABS: 2.1 10*3/uL (ref 0.7–4.0)
Lymphocytes Relative: 32.5 % (ref 12.0–46.0)
MCHC: 34.2 g/dL (ref 30.0–36.0)
MCV: 83.2 fl (ref 78.0–100.0)
MONO ABS: 0.6 10*3/uL (ref 0.1–1.0)
Monocytes Relative: 9.8 % (ref 3.0–12.0)
NEUTROS PCT: 53.6 % (ref 43.0–77.0)
Neutro Abs: 3.5 10*3/uL (ref 1.4–7.7)
Platelets: 283 10*3/uL (ref 150.0–400.0)
RBC: 5.41 Mil/uL (ref 4.22–5.81)
RDW: 13.1 % (ref 11.5–15.5)
WBC: 6.4 10*3/uL (ref 4.0–10.5)

## 2016-09-13 LAB — POC URINALSYSI DIPSTICK (AUTOMATED)
BILIRUBIN UA: NEGATIVE
GLUCOSE UA: NEGATIVE
KETONES UA: NEGATIVE
Nitrite, UA: NEGATIVE
PROTEIN UA: NEGATIVE
Urobilinogen, UA: 0.2
pH, UA: 6

## 2016-09-13 LAB — PSA: PSA: 0.84 ng/mL (ref 0.10–4.00)

## 2016-09-13 MED ORDER — CIPROFLOXACIN HCL 500 MG PO TABS: 500.0000 mg | ORAL_TABLET | Freq: Two times a day (BID) | ORAL | 0 refills | Status: DC

## 2016-09-13 NOTE — Progress Notes (Signed)
Subjective:    Patient ID: Jill PolingForrest Goelz, male    DOB: 12/08/1980, 36 y.o.   MRN: [id number]  HPI  Pt in with some pain in his rt testicle area. Pt had inguinal hernia in the past. When he had hernia he states he does not having any testicle pain. When pt is in seated position will have faint discomfort in rt testicle region. Slight faint pressure constant more when sitting.  When he stands up the pressure behind testicle area decreases. No fever, no chills or sweats. No pain on urinating. Sometimes he states feels like has to urinate frequently. Overall he thinks may have had this before his inguinal surgery and gallbladder surgery).  Pt clarifies and describes rt testicle or perineum pain as states rt behind scrotum.  He is reporting frequent urination at times. But no dysuria. No dc from penis.  No nausea, no vomiting and no abdomen pain. No decreased appetite.    Review of Systems  Constitutional: Negative for chills, fatigue and fever.  HENT: Negative for congestion and ear pain.   Respiratory: Negative for cough, chest tightness, shortness of breath and wheezing.   Cardiovascular: Negative for chest pain and palpitations.  Gastrointestinal: Negative for abdominal pain, blood in stool, constipation, diarrhea, nausea, rectal pain and vomiting.  Genitourinary: Positive for frequency.       Sometimes hestint urine flow.  Musculoskeletal: Negative for back pain.  Skin: Negative for rash.  Hematological: Negative for adenopathy. Does not bruise/bleed easily.  Psychiatric/Behavioral: Negative for behavioral problems and confusion.    Past Medical History:  Diagnosis Date  . Chicken pox   . History of fainting spells of unknown cause      Social History   Social History  . Marital status: Married    Spouse name: N/A  . Number of children: N/A  . Years of education: N/A   Occupational History  . Generator Tech    Social History Main Topics  . Smoking status: Never Smoker    . Smokeless tobacco: Never Used  . Alcohol use No  . Drug use: No  . Sexual activity: Yes    Partners: Female   Other Topics Concern  . Not on file   Social History Narrative  . No narrative on file    Past Surgical History:  Procedure Laterality Date  . VASECTOMY  2015    Family History  Problem Relation Age of Onset  . Diabetes Paternal Grandmother   . Hypertension Father     Allergies  Allergen Reactions  . Penicillins Other (See Comments)    Unknown reaction    No current outpatient prescriptions on file prior to visit.   No current facility-administered medications on file prior to visit.     BP 122/76 (BP Location: Right Arm, Patient Position: Sitting, Cuff Size: Large)   Pulse (!) 57   Temp 97.6 F (36.4 C) (Oral)   Resp 16   Ht 6' (1.829 m)   Wt 186 lb 8 oz (84.6 kg)   SpO2 100%   BMI 25.29 kg/m       Objective:   Physical Exam  General Appearance- Not in acute distress.  HEENT Eyes- Scleraeral/Conjuntiva-bilat- Not Yellow. Mouth & Throat- Normal.  Chest and Lung Exam Auscultation: Breath sounds:-Normal. Adventitious sounds:- No Adventitious sounds.  Cardiovascular Auscultation:Rythm - Regular. Heart Sounds -Normal heart sounds.  Abdomen Inspection:-Inspection Normal.  Palpation/Perucssion: Palpation and Percussion of the abdomen reveal- Non Tender but very faint rt mid groin area pain.  No mass(no direct appendix area pain), No Rebound tenderness, No rigidity(Guarding) and No Palpable abdominal masses.  Liver:-Normal.  Spleen:- Normal.   Genital- rt testicle faint tender over epidiymis.(on palpation perineum area no pain. On digit palpation rt side canal no hernia felt.Mild discomfort on exam.      Assessment & Plan:  For your rt testicle pain, faint lower inguinal region pain and frequent urination will rx cipro antibiotic and want your to get labs as well as scrotal US. [place] scheduled at 1:30 today.  Also can use ibuprofen 400  mg every 8 hours next 5 days.  Will get psa and cbc today.  I am considering uti, epididymitis and prostatis as current diagnosis.  You have some blood in urine and we need to repeat urine check in 10 days.  Will go ahead and refer your surgeon so he can recheck the inguinal canal. I did not feel hernia today.  Follow up in 10 days or as needed. If severe or changing signs or symptoms over weekend then ED evaluation.

## 2016-09-13 NOTE — Addendum Note (Signed)
Addended by: Gwenevere AbbotSAGUIER, Addalynn Kumari M on: 09/13/2016 05:27 PM   Modules accepted: Orders

## 2016-09-13 NOTE — Patient Instructions (Addendum)
For your rt testicle pain, faint lower inguinal region pain and frequent urination will rx cipro antibiotic and want your to get labs as well as scrotal US. US scheduled at 1:30 today.  Also can use ibuprofen 400 mg every 8 hours next 5 days.  Will get psa and cbc today.  I am considering uti, epididymitis and prostatis as current diagnosis.  You have some blood in urine and we need to repeat urine check in 10 days.  Will go ahead and refer your surgeon so he can recheck the inguinal canal. I did not feel hernia today.  Follow up in 10 days or as needed. If severe or changing signs or symptoms over weekend then ED evaluation.

## 2016-09-13 NOTE — Progress Notes (Signed)
Pre visit review using our clinic review tool, if applicable. No additional management support is needed unless otherwise documented below in the visit note/SLS  

## 2016-09-15 ENCOUNTER — Encounter: Payer: Self-pay | Admitting: Medical

## 2016-09-15 LAB — CULTURE, URINE COMPREHENSIVE: Organism ID, Bacteria: NO GROWTH

## 2016-09-27 ENCOUNTER — Ambulatory Visit (INDEPENDENT_AMBULATORY_CARE_PROVIDER_SITE_OTHER): Payer: BLUE CROSS/BLUE SHIELD | Admitting: Medical

## 2016-09-27 ENCOUNTER — Encounter: Payer: Self-pay | Admitting: Medical

## 2016-09-27 VITALS — BP 114/62 | HR 57 | Temp 97.7°F | Resp 16 | Ht 72.0 in | Wt 187.0 lb

## 2016-09-27 DIAGNOSIS — R1031 Right lower quadrant pain: Secondary | ICD-10-CM

## 2016-09-27 DIAGNOSIS — N50811 Right testicular pain: Secondary | ICD-10-CM

## 2016-09-27 MED ORDER — CIPROFLOXACIN HCL 500 MG PO TABS: 500.0000 mg | ORAL_TABLET | Freq: Two times a day (BID) | ORAL | 0 refills | Status: DC

## 2016-09-27 NOTE — Progress Notes (Signed)
Subjective:    Patient ID: Jeffrey Mckinney, male    DOB: Oct 15, 1980, 36 y.o.   MRN: [id number]  HPI  Pt still having the same intermittent mild groin region pain. He feels maybe in testicle. He states feels when he sits. When he stands at work no pain. He states no sharp pain. When he has describes constant mild dull pain.   On last visit did psa, cbc, and urine cuture.  UA showed little trace blood and trace leukoctes.  Pt declined US of the scrotum. Pt does have history of vasectomy.    Review of Systems  Constitutional: Negative for chills, fatigue and fever.  Respiratory: Negative for cough, chest tightness, shortness of breath and wheezing.   Cardiovascular: Negative for chest pain and palpitations.  Gastrointestinal: Negative for abdominal pain.  Genitourinary: Negative for decreased urine volume, difficulty urinating, discharge, dysuria, flank pain, frequency, penile pain, penile swelling and scrotal swelling.       See hpi.  Musculoskeletal: Negative for back pain.  Skin: Negative for rash.    Past Medical History:  Diagnosis Date  . Chicken pox   . History of fainting spells of unknown cause      Social History   Social History  . Marital status: Married    Spouse name: N/A  . Number of children: N/A  . Years of education: N/A   Occupational History  . Generator Tech    Social History Main Topics  . Smoking status: Never Smoker  . Smokeless tobacco: Never Used  . Alcohol use No  . Drug use: No  . Sexual activity: Yes    Partners: Female   Other Topics Concern  . Not on file   Social History Narrative  . No narrative on file    Past Surgical History:  Procedure Laterality Date  . VASECTOMY  2015    Family History  Problem Relation Age of Onset  . Diabetes Paternal Grandmother   . Hypertension Father     Allergies  Allergen Reactions  . Penicillins Other (See Comments)    Unknown reaction    Current Outpatient Prescriptions on File Prior  to Visit  Medication Sig Dispense Refill  . acetaminophen (TYLENOL) 325 MG tablet Take 650 mg by mouth every 6 (six) hours as needed.     No current facility-administered medications on file prior to visit.     BP 114/62 (BP Location: Left Arm, Patient Position: Sitting, Cuff Size: Large)   Pulse (!) 57   Temp 97.7 F (36.5 C) (Oral)   Resp 16   Ht 6' (1.829 m)   Wt 187 lb (84.8 kg)   SpO2 100%   BMI 25.36 kg/m       Objective:   Physical Exam  General- No acute distress. Pleasant patient. Neck- Full range of motion, no jvd Lungs- Clear, even and unlabored. Heart- regular rate and rhythm. Neurologic- CNII- XII grossly intact.  Genital- rt testicle feels normal but  Mild tender in epididymus region. Left side not tender. Rt side inguinal hernia scar distal end of scar just above canal mild tender. Back- no CVA tenderness  Abd- soft nontender, +bs, no rebound or guarding. No organomegaly. No pain directly in rt lower quadrant area.     Assessment & Plan:  Your inguinal region pain could be associated to prior surgery. Would go ahead and follow through with surgeon referral. Since they called you please call them back and arrange appointment.  Will also refer you to  urologist office. You do have pain in epididymus area. Will rx another 7 days of cipro. Ibuprofen otc as well.  If you have increased pain in testicle you would need the former ordered US. If severe then stat through ED. If mild-moderate increase then could try to order out pt basis.  Follow up with us in 2-3 weeks post specialist appointment or as needed  Imo Cumbie, Ramon DredgeEdward, PA-C

## 2016-09-27 NOTE — Progress Notes (Signed)
Pre visit review using our clinic review tool, if applicable. No additional management support is needed unless otherwise documented below in the visit note/SLS  

## 2016-09-27 NOTE — Patient Instructions (Addendum)
Your inguinal region pain could be associated to prior surgery. Would go ahead and follow through with surgeon referral. Since they called you please call them back and arrange appointment.  Will also refer you to urologist office. You do have pain in epididymus area. Will rx another 7 days of cipro. Ibuprofen otc as well.  If you have increased pain in testicle you would need the former ordered US. If severe then stat through ED. If mild-moderate increase then could try to order out pt basis.  Follow up with us in 2-3 weeks post specialist appointment or as needed

## 2018-04-10 IMAGING — US US ABDOMEN COMPLETE
1 series · 13 of 25 positions shown · non-contrast
Comparison: [facility] Abdomen and Pelvis 07/01/2003

CLINICAL DATA: 35-year-old male with postprandial upper abdominal
pain nausea and diarrhea for 4 months. Prior appendectomy. Initial
encounter.

EXAM:
ABDOMEN ULTRASOUND COMPLETE

[Series 1: us abdomen complete · 0.30mm/px · 13 of 77 slices shown]
[im 1/77]
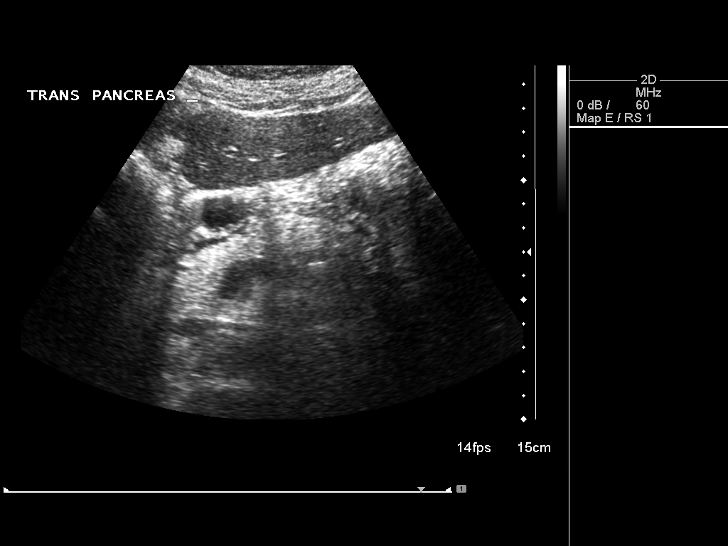
[im 7/77]
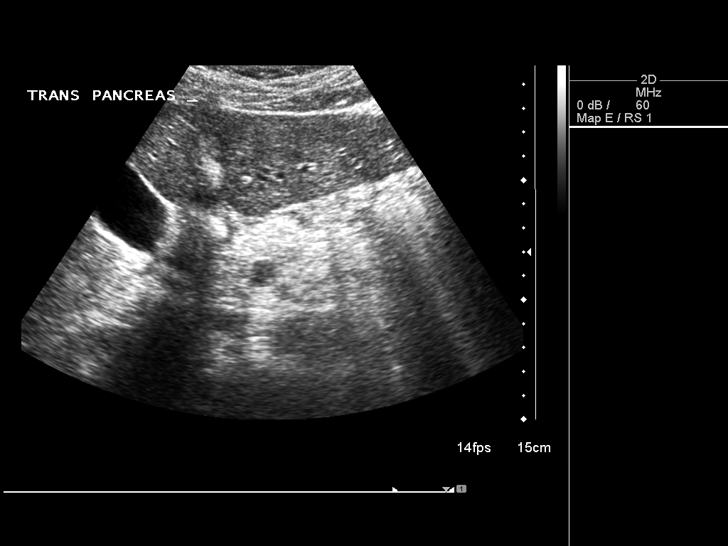
[im 13/77]
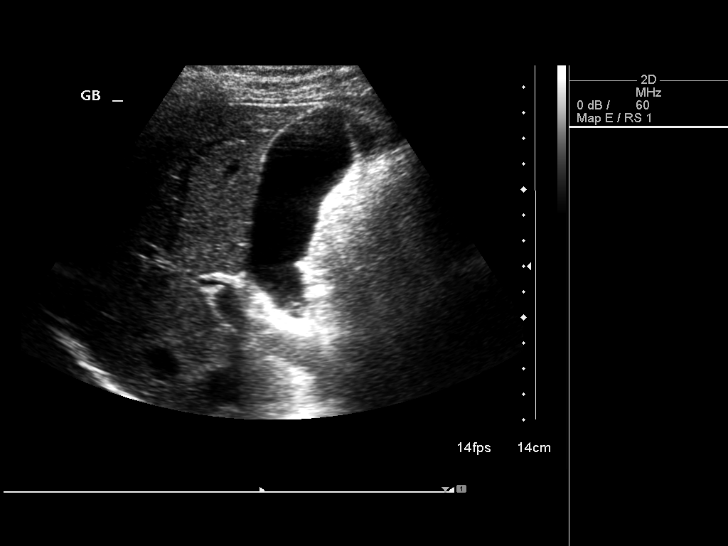
[im 20/77]
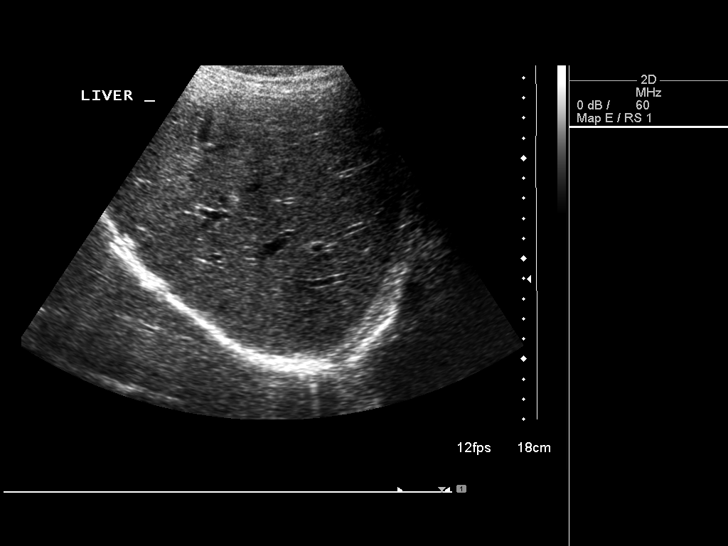
[im 26/77]
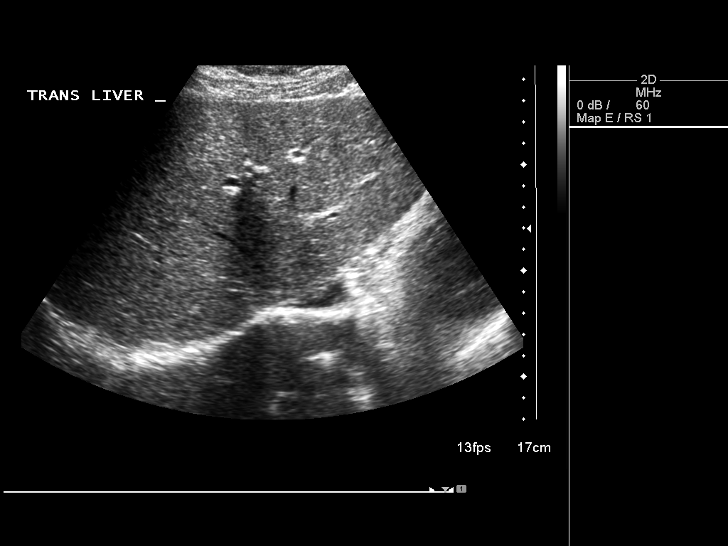
[im 32/77]
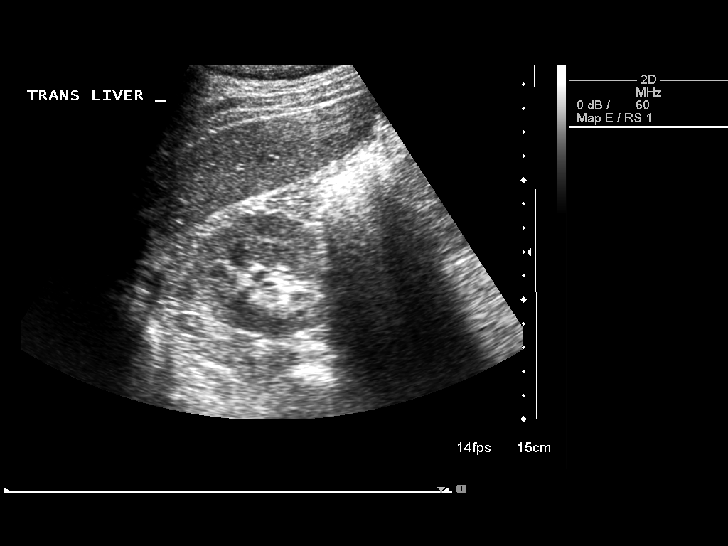
[im 39/77]
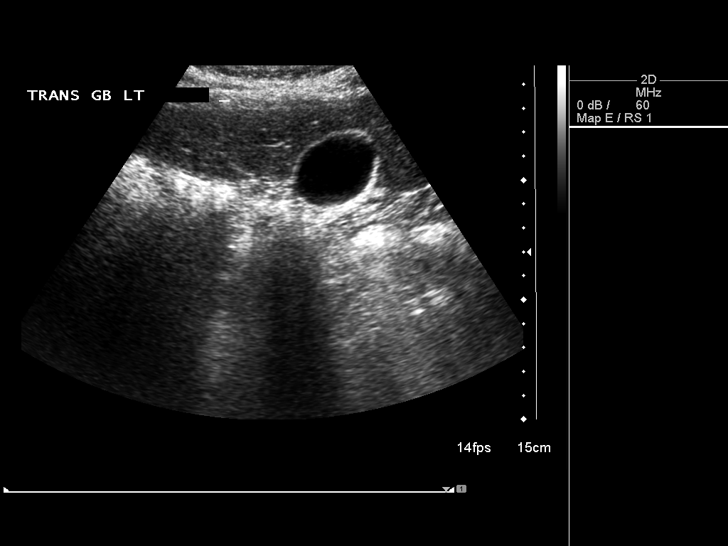
[im 45/77]
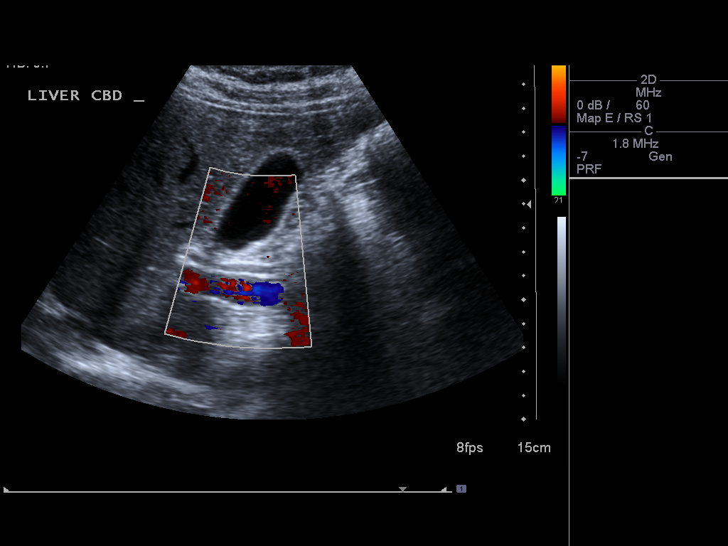
[im 51/77]
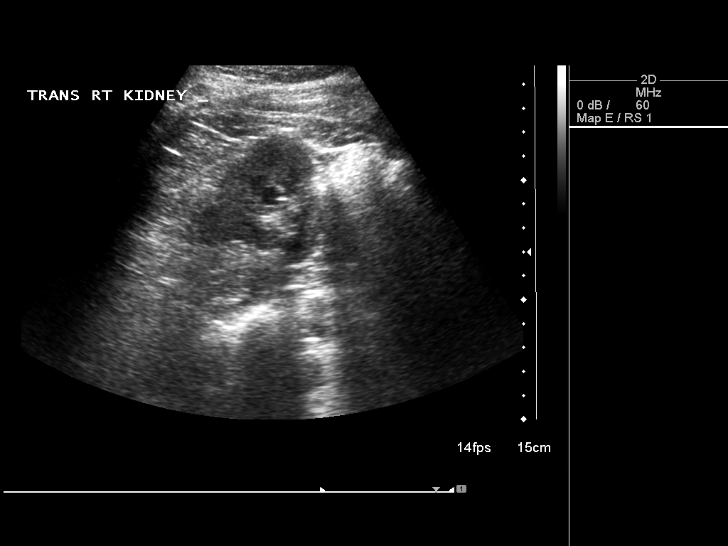
[im 58/77]
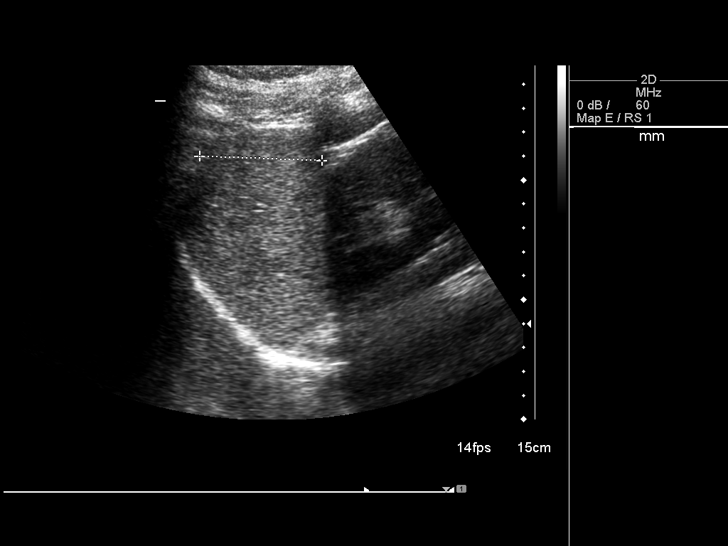
[im 64/77]
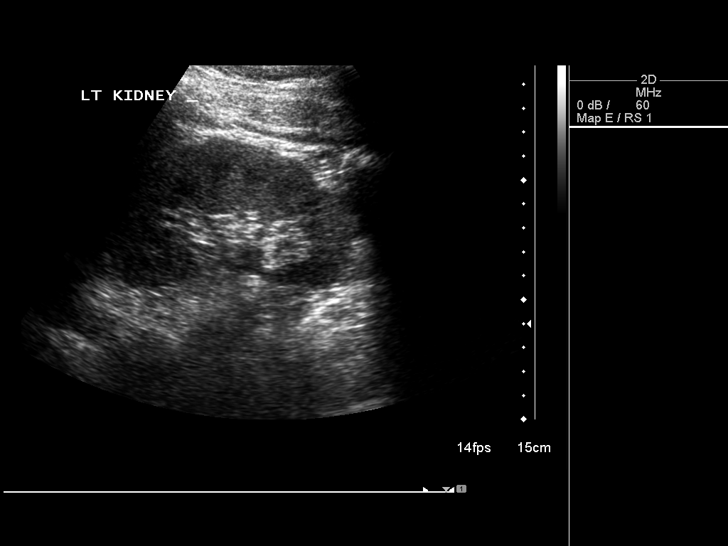
[im 70/77]
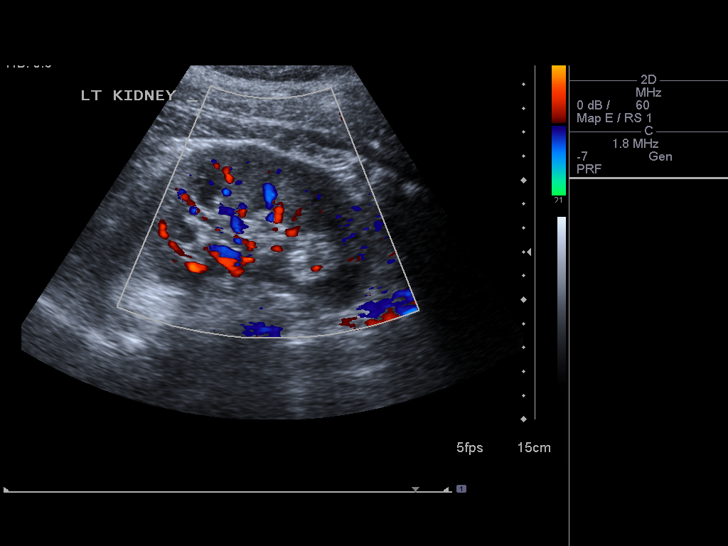
[im 77/77]
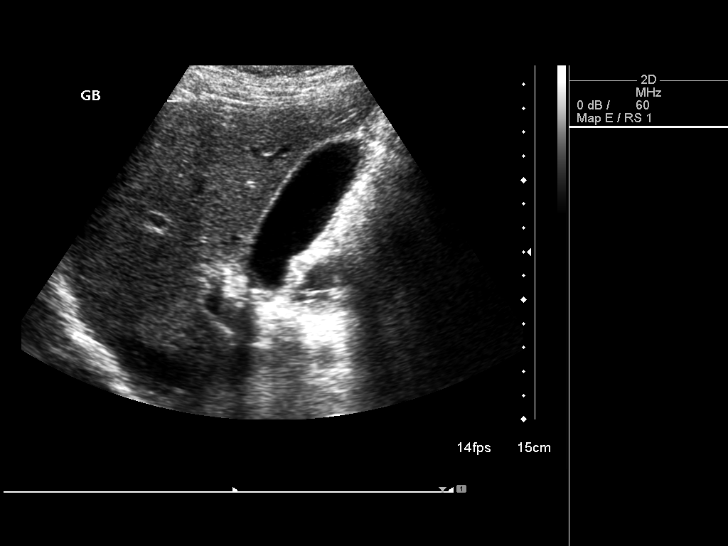

[13 of 25 positions shown; findings below may reference images not displayed]

FINDINGS: Gallbladder: Normal gallbladder wall thickness of 1-2 mm. No
pericholecystic fluid. No sonographic Murphy sign elicited.
Incidental Phrygian cap (normal variant). On upright views of the
gallbladder there is a small volume of sludge suspected in the
gallbladder neck (image 79). No gallstones identified.

Common bile duct: Diameter: 4 mm, normal

Liver: No focal lesion identified. Within normal limits in
parenchymal echogenicity.

IVC: No abnormality visualized.

Pancreas: Visualized portion unremarkable.

Spleen: Size and appearance within normal limits.

Right Kidney: Length: 10.4 cm. Incidental 11 mm simple appearing
lower pole cyst (image 50). Echogenicity within normal limits. No
mass or hydronephrosis visualized.

Left Kidney: Length: 11 cm. Echogenicity within normal limits. No
mass or hydronephrosis visualized.

Abdominal aorta: No aneurysm visualized.

Other findings: None.
IMPRESSION: 1. Small volume of gallbladder sludge suspected, but definite
cholelithiasis and no evidence of acute cholecystitis or biliary
obstruction.
2. Otherwise negative abdomen ultrasound.

## 2018-05-01 ENCOUNTER — Ambulatory Visit (INDEPENDENT_AMBULATORY_CARE_PROVIDER_SITE_OTHER): Payer: BLUE CROSS/BLUE SHIELD | Admitting: Medical

## 2018-05-01 ENCOUNTER — Encounter: Payer: Self-pay | Admitting: Medical

## 2018-05-01 VITALS — BP 114/65 | HR 50 | Temp 97.8°F | Resp 16 | Ht 72.0 in | Wt 189.8 lb

## 2018-05-01 DIAGNOSIS — Z113 Encounter for screening for infections with a predominantly sexual mode of transmission: Secondary | ICD-10-CM | POA: Diagnosis not present

## 2018-05-01 DIAGNOSIS — Z0000 Encounter for general adult medical examination without abnormal findings: Secondary | ICD-10-CM

## 2018-05-01 LAB — COMPREHENSIVE METABOLIC PANEL
ALBUMIN: 5.1 g/dL (ref 3.5–5.2)
ALT: 26 U/L (ref 0–53)
AST: 17 U/L (ref 0–37)
Alkaline Phosphatase: 71 U/L (ref 39–117)
BILIRUBIN TOTAL: 0.4 mg/dL (ref 0.2–1.2)
BUN: 14 mg/dL (ref 6–23)
CALCIUM: 9.9 mg/dL (ref 8.4–10.5)
CO2: 30 mEq/L (ref 19–32)
Chloride: 102 mEq/L (ref 96–112)
Creatinine, Ser: 1.03 mg/dL (ref 0.40–1.50)
GFR: 87.15 mL/min (ref >60.00)
GLUCOSE: 100 mg/dL — AB (ref 70–99)
POTASSIUM: 4.1 meq/L (ref 3.5–5.1)
Sodium: 139 mEq/L (ref 135–145)
TOTAL PROTEIN: 7.8 g/dL (ref 6.0–8.3)

## 2018-05-01 LAB — POC URINALSYSI DIPSTICK (AUTOMATED)
BILIRUBIN UA: NEGATIVE
Glucose, UA: NEGATIVE
Ketones, UA: NEGATIVE
LEUKOCYTES UA: NEGATIVE
NITRITE UA: NEGATIVE
PROTEIN UA: NEGATIVE
Spec Grav, UA: >=1.03 — AB (ref 1.010–1.025)
UROBILINOGEN UA: NEGATIVE U/dL — AB
pH, UA: 6 (ref 5.0–8.0)

## 2018-05-01 LAB — CBC WITH DIFFERENTIAL/PLATELET
BASOS ABS: 0.1 10*3/uL (ref 0.0–0.1)
Basophils Relative: 0.7 % (ref 0.0–3.0)
EOS PCT: 5.2 % — AB (ref 0.0–5.0)
Eosinophils Absolute: 0.4 10*3/uL (ref 0.0–0.7)
HEMATOCRIT: 47.4 % (ref 39.0–52.0)
Hemoglobin: 16 g/dL (ref 13.0–17.0)
Lymphocytes Relative: 35.9 % (ref 12.0–46.0)
Lymphs Abs: 2.6 10*3/uL (ref 0.7–4.0)
MCHC: 33.8 g/dL (ref 30.0–36.0)
MCV: 83.8 fl (ref 78.0–100.0)
MONOS PCT: 7.9 % (ref 3.0–12.0)
Monocytes Absolute: 0.6 10*3/uL (ref 0.1–1.0)
NEUTROS PCT: 50.3 % (ref 43.0–77.0)
Neutro Abs: 3.7 10*3/uL (ref 1.4–7.7)
Platelets: 253 10*3/uL (ref 150.0–400.0)
RBC: 5.66 Mil/uL (ref 4.22–5.81)
RDW: 13.6 % (ref 11.5–15.5)
WBC: 7.3 10*3/uL (ref 4.0–10.5)

## 2018-05-01 NOTE — Progress Notes (Signed)
Subjective:    Patient ID: Jeffrey Mckinney, male    DOB: Sep 22, 1980, 37 y.o.   MRN: [id number]  HPI  Pt in for cpe.  Pt is not fasting today.   Pt works on generators, No exercise, 1-2 cups coke a day/much less than before, eats out a lot at lunch, married- 2 children., Non smoker and no alcohol.  Pt declines flu vaccine.     Review of Systems  Constitutional: Negative for chills, fatigue and fever.  HENT: Negative for congestion, ear discharge, postnasal drip and sore throat.   Respiratory: Negative for cough, chest tightness, shortness of breath and wheezing.   Cardiovascular: Negative for chest pain and palpitations.  Gastrointestinal: Negative for abdominal pain.  Genitourinary: Negative for dysuria, flank pain, hematuria, scrotal swelling, testicular pain and urgency.  Musculoskeletal: Negative for back pain.  Skin: Negative for rash.  Neurological: Negative for dizziness, numbness and headaches.  Hematological: Negative for adenopathy. Does not bruise/bleed easily.  Psychiatric/Behavioral: Negative for behavioral problems and confusion.    Past Medical History:  Diagnosis Date  . Chicken pox   . History of fainting spells of unknown cause      Social History   Socioeconomic History  . Marital status: Married    Spouse name: Not on file  . Number of children: Not on file  . Years of education: Not on file  . Highest education level: Not on file  Occupational History  . Occupation: Estate manager/land agent Needs  . Financial resource strain: Not on file  . Food insecurity:    Worry: Not on file    Inability: Not on file  . Transportation needs:    Medical: Not on file    Non-medical: Not on file  Tobacco Use  . Smoking status: Never Smoker  . Smokeless tobacco: Never Used  Substance and Sexual Activity  . Alcohol use: No    Alcohol/week: 0.0 standard drinks  . Drug use: No  . Sexual activity: Yes    Partners: Female  Lifestyle  . Physical activity:   Days per week: Not on file    Minutes per session: Not on file  . Stress: Not on file  Relationships  . Social connections:    Talks on phone: Not on file    Gets together: Not on file    Attends religious service: Not on file    Active member of club or organization: Not on file    Attends meetings of clubs or organizations: Not on file    Relationship status: Not on file  . Intimate partner violence:    Fear of current or ex partner: Not on file    Emotionally abused: Not on file    Physically abused: Not on file    Forced sexual activity: Not on file  Other Topics Concern  . Not on file  Social History Narrative  . Not on file    Past Surgical History:  Procedure Laterality Date  . VASECTOMY  2015    Family History  Problem Relation Age of Onset  . Diabetes Paternal Grandmother   . Hypertension Father     Allergies  Allergen Reactions  . Penicillins Other (See Comments)    Unknown reaction    No current outpatient medications on file prior to visit.   No current facility-administered medications on file prior to visit.     BP 114/65   Pulse (!) 50   Temp 97.8 F (36.6 C) (Oral)   Resp  16   Ht 6' (1.829 m)   Wt 189 lb 12.8 oz (86.1 kg)   SpO2 100%   BMI 25.74 kg/m       Objective:   Physical Exam  General Mental Status- Alert. General Appearance- Not in acute distress.   Skin Multiple scattered moles on back. None worrisome presenlty. He sees dermatologist every year. Will them in 2 weeks.  Neck Carotid Arteries- Normal color. Moisture- Normal Moisture. No carotid bruits. No JVD.  Chest and Lung Exam Auscultation: Breath Sounds:-Normal.  Cardiovascular Auscultation:Rythm- Regular. Murmurs & Other Heart Sounds:Auscultation of the heart reveals- No Murmurs.  Abdomen Inspection:-Inspeection Normal. Palpation/Percussion:Note:No mass. Palpation and Percussion of the abdomen reveal- Non Tender, Non Distended + BS, no rebound or  guarding.   Neurologic Cranial Nerve exam:- CN III-XII intact(No nystagmus), symmetric smile. Strength:- 5/5 equal and symmetric strength both upper and lower extremities.  Genital exam- declined.      Assessment & Plan:  For you wellness exam today I have ordered cbc, cmpl, ua and hiv. Not fasting so will put future lipid in when labs come back to me for review.([facility] staff state 2 day limit and you can't come back today).  Vaccine flu declined.  Recommend exercise and healthy diet.  We will let you know lab results as they come in.  Follow up date appointment will be determined after lab review.   Esperanza Richters, PA-C

## 2018-05-01 NOTE — Addendum Note (Signed)
Addended by: Orlene Och on: 05/01/2018 03:58 PM   Modules accepted: Orders

## 2018-05-01 NOTE — Patient Instructions (Addendum)
For you wellness exam today I have ordered cbc, cmp  hiv and ua.. Not fasting so will put future lipid in when labs come back to me for review.(MA staff state 2 day limit and you can't come back today).  Vaccine flu declined.  Recommend exercise and healthy diet.  We will let you know lab results as they come in.  Follow up date appointment will be determined after lab review.     Preventive Care 18-39 Years, Male Preventive care refers to lifestyle choices and visits with your health care provider that can promote health and wellness. What does preventive care include?  A yearly physical exam. This is also called an annual well check.  Dental exams once or twice a year.  Routine eye exams. Ask your health care provider how often you should have your eyes checked.  Personal lifestyle choices, including: ? Daily care of your teeth and gums. ? Regular physical activity. ? Eating a healthy diet. ? Avoiding tobacco and drug use. ? Limiting alcohol use. ? Practicing safe sex. What happens during an annual well check? The services and screenings done by your health care provider during your annual well check will depend on your age, overall health, lifestyle risk factors, and family history of disease. Counseling Your health care provider may ask you questions about your:  Alcohol use.  Tobacco use.  Drug use.  Emotional well-being.  Home and relationship well-being.  Sexual activity.  Eating habits.  Work and work Statistician.  Screening You may have the following tests or measurements:  Height, weight, and BMI.  Blood pressure.  Lipid and cholesterol levels. These may be checked every 5 years starting at age 71.  Diabetes screening. This is done by checking your blood sugar (glucose) after you have not eaten for a while (fasting).  Skin check.  Hepatitis C blood test.  Hepatitis B blood test.  Sexually transmitted disease (STD) testing.  Discuss your  test results, treatment options, and if necessary, the need for more tests with your health care provider. Vaccines Your health care provider may recommend certain vaccines, such as:  Influenza vaccine. This is recommended every year.  Tetanus, diphtheria, and acellular pertussis (Tdap, Td) vaccine. You may need a Td booster every 10 years.  Varicella vaccine. You may need this if you have not been vaccinated.  HPV vaccine. If you are 38 or younger, you may need three doses over 6 months.  Measles, mumps, and rubella (MMR) vaccine. You may need at least one dose of MMR.You may also need a second dose.  Pneumococcal 13-valent conjugate (PCV13) vaccine. You may need this if you have certain conditions and have not been vaccinated.  Pneumococcal polysaccharide (PPSV23) vaccine. You may need one or two doses if you smoke cigarettes or if you have certain conditions.  Meningococcal vaccine. One dose is recommended if you are age 35-21 years and a first-year college student living in a residence hall, or if you have one of several medical conditions. You may also need additional booster doses.  Hepatitis A vaccine. You may need this if you have certain conditions or if you travel or work in places where you may be exposed to hepatitis A.  Hepatitis B vaccine. You may need this if you have certain conditions or if you travel or work in places where you may be exposed to hepatitis B.  Haemophilus influenzae type b (Hib) vaccine. You may need this if you have certain risk factors.  Talk to  your health care provider about which screenings and vaccines you need and how often you need them. This information is not intended to replace advice given to you by your health care provider. Make sure you discuss any questions you have with your health care provider. Document Released: 08/20/2001 Document Revised: 03/13/2016 Document Reviewed: 04/25/2015 Elsevier Interactive Patient Education  [employer].

## 2018-05-02 ENCOUNTER — Telehealth: Payer: Self-pay | Admitting: Medical

## 2018-05-02 DIAGNOSIS — R319 Hematuria, unspecified: Secondary | ICD-10-CM

## 2018-05-02 DIAGNOSIS — E785 Hyperlipidemia, unspecified: Secondary | ICD-10-CM

## 2018-05-02 LAB — HIV ANTIBODY (ROUTINE TESTING W REFLEX): HIV 1&2 Ab, 4th Generation: NONREACTIVE

## 2018-05-02 NOTE — Telephone Encounter (Signed)
Future urine order placed.  As well as lipid panel.

## 2018-05-03 ENCOUNTER — Encounter: Payer: Self-pay | Admitting: Medical

## 2018-05-04 ENCOUNTER — Other Ambulatory Visit (INDEPENDENT_AMBULATORY_CARE_PROVIDER_SITE_OTHER): Payer: BLUE CROSS/BLUE SHIELD

## 2018-05-04 DIAGNOSIS — R319 Hematuria, unspecified: Secondary | ICD-10-CM

## 2018-05-04 DIAGNOSIS — E785 Hyperlipidemia, unspecified: Secondary | ICD-10-CM

## 2018-05-05 ENCOUNTER — Encounter: Payer: Self-pay | Admitting: Medical

## 2018-05-05 ENCOUNTER — Telehealth: Payer: Self-pay | Admitting: Medical

## 2018-05-05 DIAGNOSIS — R319 Hematuria, unspecified: Secondary | ICD-10-CM

## 2018-05-05 LAB — URINALYSIS, ROUTINE W REFLEX MICROSCOPIC
BILIRUBIN URINE: NEGATIVE
KETONES UR: NEGATIVE
LEUKOCYTES UA: NEGATIVE
Nitrite: NEGATIVE
PH: 5.5 (ref 5.0–8.0)
RBC / HPF: NONE SEEN (ref 0–?)
Specific Gravity, Urine: >=1.03 — AB (ref 1.000–1.030)
TOTAL PROTEIN, URINE-UPE24: NEGATIVE
UROBILINOGEN UA: 0.2 (ref 0.0–1.0)
Urine Glucose: NEGATIVE
WBC UA: NONE SEEN (ref 0–?)

## 2018-05-05 LAB — LIPID PANEL
CHOL/HDL RATIO: 4
CHOLESTEROL: 152 mg/dL (ref 0–200)
HDL: 38.7 mg/dL — ABNORMAL LOW (ref >39.00)
LDL CALC: 96 mg/dL (ref 0–99)
NonHDL: 112.96
TRIGLYCERIDES: 86 mg/dL (ref 0.0–149.0)
VLDL: 17.2 mg/dL (ref 0.0–40.0)

## 2018-05-05 NOTE — Telephone Encounter (Signed)
Referral to urologist placed. 

## 2018-05-06 ENCOUNTER — Telehealth: Payer: Self-pay | Admitting: Medical

## 2018-05-06 NOTE — Telephone Encounter (Signed)
Opened to review 

## 2018-05-06 NOTE — Telephone Encounter (Signed)
Pt wants to see urologist Ihor Gully. Also he states needs his appointment to be on Friday.

## 2018-07-02 ENCOUNTER — Telehealth: Payer: BLUE CROSS/BLUE SHIELD | Admitting: Family

## 2018-07-02 ENCOUNTER — Encounter: Payer: Self-pay | Admitting: Medical

## 2018-07-02 DIAGNOSIS — B9689 Other specified bacterial agents as the cause of diseases classified elsewhere: Secondary | ICD-10-CM

## 2018-07-02 DIAGNOSIS — J208 Acute bronchitis due to other specified organisms: Secondary | ICD-10-CM

## 2018-07-02 MED ORDER — DOXYCYCLINE HYCLATE 100 MG PO TABS: 100.0000 mg | ORAL_TABLET | Freq: Two times a day (BID) | ORAL | 0 refills | Status: DC

## 2018-07-02 MED ORDER — BENZONATATE 100 MG PO CAPS: 100.0000 mg | ORAL_CAPSULE | Freq: Three times a day (TID) | ORAL | 0 refills | Status: DC | PRN

## 2018-07-02 NOTE — Progress Notes (Signed)
Thank you for the details you included in the comment boxes. Those details are very helpful in determining the best course of treatment for you and help us to provide the best care. Given that this is day 7 and you also had a fever, we will view this as probable bacterial bronchitis. It could have started as a sinus infection which moved into your lungs. See below.  We are sorry that you are not feeling well.  Here is how we plan to help!  Based on your presentation I believe you most likely have A cough due to bacteria.  When patients have a fever and a productive cough with a change in color or increased sputum production, we are concerned about bacterial bronchitis.  If left untreated it can progress to pneumonia.  If your symptoms do not improve with your treatment plan it is important that you contact your provider.   I have prescribed Doxycycline 100 mg twice a day for 7 days     In addition you may use A non-prescription cough medication called Mucinex DM: take 2 tablets every 12 hours. and A prescription cough medication called Tessalon Perles 100mg . You may take 1-2 capsules every 8 hours as needed for your cough.    From your responses in the eVisit questionnaire you describe inflammation in the upper respiratory tract which is causing a significant cough.  This is commonly called Bronchitis and has four common causes:    Allergies  Viral Infections  Acid Reflux  Bacterial Infection Allergies, viruses and acid reflux are treated by controlling symptoms or eliminating the cause. An example might be a cough caused by taking certain blood pressure medications. You stop the cough by changing the medication. Another example might be a cough caused by acid reflux. Controlling the reflux helps control the cough.  USE OF BRONCHODILATOR ("RESCUE") INHALERS: There is a risk from using your bronchodilator too frequently.  The risk is that over-reliance on a medication which only relaxes the  muscles surrounding the breathing tubes can reduce the effectiveness of medications prescribed to reduce swelling and congestion of the tubes themselves.  Although you feel brief relief from the bronchodilator inhaler, your asthma may actually be worsening with the tubes becoming more swollen and filled with mucus.  This can delay other crucial treatments, such as oral steroid medications. If you need to use a bronchodilator inhaler daily, several times per day, you should discuss this with your provider.  There are probably better treatments that could be used to keep your asthma under control.     HOME CARE . Only take medications as instructed by your medical team. . Complete the entire course of an antibiotic. . Drink plenty of fluids and get plenty of rest. . Avoid close contacts especially the very young and the elderly . Cover your mouth if you cough or cough into your sleeve. . Always remember to wash your hands . A steam or ultrasonic humidifier can help congestion.   GET HELP RIGHT AWAY IF: . You develop worsening fever. . You become short of breath . You cough up blood. . Your symptoms persist after you have completed your treatment plan MAKE SURE YOU   Understand these instructions.  Will watch your condition.  Will get help right away if you are not doing well or get worse.  Your e-visit answers were reviewed by a board certified advanced clinical practitioner to complete your personal care plan.  Depending on the condition, your plan could  have included both over the counter or prescription medications. If there is a problem please reply  once you have received a response from your provider. Your safety is important to us.  If you have drug allergies check your prescription carefully.    You can use MyChart to ask questions about today's visit, request a non-urgent call back, or ask for a work or school excuse for 24 hours related to this e-Visit. If it has been greater than  24 hours you will need to follow up with your provider, or enter a new e-Visit to address those concerns. You will get an e-mail in the next two days asking about your experience.  I hope that your e-visit has been valuable and will speed your recovery. Thank you for using e-visits.

## 2020-01-21 ENCOUNTER — Other Ambulatory Visit: Payer: Self-pay

## 2020-01-21 ENCOUNTER — Ambulatory Visit (INDEPENDENT_AMBULATORY_CARE_PROVIDER_SITE_OTHER): Payer: 59 | Admitting: Medical

## 2020-01-21 VITALS — BP 120/80 | HR 74 | Resp 18 | Ht 72.0 in | Wt 202.0 lb

## 2020-01-21 DIAGNOSIS — Z0000 Encounter for general adult medical examination without abnormal findings: Secondary | ICD-10-CM | POA: Diagnosis not present

## 2020-01-21 DIAGNOSIS — Z1283 Encounter for screening for malignant neoplasm of skin: Secondary | ICD-10-CM

## 2020-01-21 LAB — COMPREHENSIVE METABOLIC PANEL
ALT: 25 U/L (ref 0–53)
AST: 18 U/L (ref 0–37)
Albumin: 5 g/dL (ref 3.5–5.2)
Alkaline Phosphatase: 66 U/L (ref 39–117)
BUN: 13 mg/dL (ref 6–23)
CO2: 30 mEq/L (ref 19–32)
Calcium: 9.7 mg/dL (ref 8.4–10.5)
Chloride: 103 mEq/L (ref 96–112)
Creatinine, Ser: 1.11 mg/dL (ref 0.40–1.50)
GFR: 74.5 mL/min (ref >60.00)
Glucose, Bld: 104 mg/dL — ABNORMAL HIGH (ref 70–99)
Potassium: 4.4 mEq/L (ref 3.5–5.1)
Sodium: 138 mEq/L (ref 135–145)
Total Bilirubin: 0.5 mg/dL (ref 0.2–1.2)
Total Protein: 7.6 g/dL (ref 6.0–8.3)

## 2020-01-21 LAB — CBC WITH DIFFERENTIAL/PLATELET
Basophils Absolute: 0.1 10*3/uL (ref 0.0–0.1)
Basophils Relative: 1.2 % (ref 0.0–3.0)
Eosinophils Absolute: 0.4 10*3/uL (ref 0.0–0.7)
Eosinophils Relative: 5 % (ref 0.0–5.0)
HCT: 46.3 % (ref 39.0–52.0)
Hemoglobin: 15.5 g/dL (ref 13.0–17.0)
Lymphocytes Relative: 34 % (ref 12.0–46.0)
Lymphs Abs: 2.6 10*3/uL (ref 0.7–4.0)
MCHC: 33.6 g/dL (ref 30.0–36.0)
MCV: 84.7 fl (ref 78.0–100.0)
Monocytes Absolute: 0.6 10*3/uL (ref 0.1–1.0)
Monocytes Relative: 8.2 % (ref 3.0–12.0)
Neutro Abs: 3.9 10*3/uL (ref 1.4–7.7)
Neutrophils Relative %: 51.6 % (ref 43.0–77.0)
Platelets: 235 10*3/uL (ref 150.0–400.0)
RBC: 5.46 Mil/uL (ref 4.22–5.81)
RDW: 13.9 % (ref 11.5–15.5)
WBC: 7.6 10*3/uL (ref 4.0–10.5)

## 2020-01-21 LAB — LIPID PANEL
Cholesterol: 139 mg/dL (ref 0–200)
HDL: 33.5 mg/dL — ABNORMAL LOW (ref >39.00)
LDL Cholesterol: 86 mg/dL (ref 0–99)
NonHDL: 105.9
Total CHOL/HDL Ratio: 4
Triglycerides: 100 mg/dL (ref 0.0–149.0)
VLDL: 20 mg/dL (ref 0.0–40.0)

## 2020-01-21 NOTE — Addendum Note (Signed)
Addended by: Gwenevere Abbot on: 01/21/2020 10:24 AM   Modules accepted: Orders

## 2020-01-21 NOTE — Progress Notes (Addendum)
Subjective:    Patient ID: Jeffrey Mckinney, male    DOB: Mar 16, 1981, 39 y.o.   MRN: [id number]  HPI Pt in for cpe.  Pt is fasting today.   Pt works on generators, No exercise, 1  cups coke a day/much less than before, eats out a lot at lunch, married- 2 children., Non smoker and no alcohol.   Review of Systems  Constitutional: Negative for chills, diaphoresis and fever.  HENT: Negative for congestion.   Respiratory: Negative for cough, chest tightness, wheezing and stridor.   Cardiovascular: Negative for chest pain and palpitations.  Gastrointestinal: Negative for abdominal distention, abdominal pain, constipation, nausea and rectal pain.       Notes ever since gallbladder removed has looser stools after eating worse after eating greasy mels.  Genitourinary: Negative for dysuria, flank pain and frequency.  Musculoskeletal: Negative for back pain.  Skin: Negative for rash.  Neurological: Negative for dizziness, syncope, weakness, numbness and headaches.  Hematological: Negative for adenopathy. Does not bruise/bleed easily.  Psychiatric/Behavioral: Negative for behavioral problems and decreased concentration.     Past Medical History:  Diagnosis Date  . Chicken pox   . History of fainting spells of unknown cause      Social History   Socioeconomic History  . Marital status: Married    Spouse name: Not on file  . Number of children: Not on file  . Years of education: Not on file  . Highest education level: Not on file  Occupational History  . Occupation: Pharmacist, hospital  Tobacco Use  . Smoking status: Never Smoker  . Smokeless tobacco: Never Used  Substance and Sexual Activity  . Alcohol use: No    Alcohol/week: 0.0 standard drinks  . Drug use: No  . Sexual activity: Yes    Partners: Female  Other Topics Concern  . Not on file  Social History Narrative  . Not on file   Social Determinants of Health   Financial Resource Strain:   . Difficulty of Paying Living  Expenses:   Food Insecurity:   . Worried About Programme researcher, broadcasting/film/video in the Last Year:   . Barista in the Last Year:   Transportation Needs:   . Freight forwarder (Medical):   Marland Kitchen Lack of Transportation (Non-Medical):   Physical Activity:   . Days of Exercise per Week:   . Minutes of Exercise per Session:   Stress:   . Feeling of Stress :   Social Connections:   . Frequency of Communication with Friends and Family:   . Frequency of Social Gatherings with Friends and Family:   . Attends Religious Services:   . Active Member of Clubs or Organizations:   . Attends Banker Meetings:   Marland Kitchen Marital Status:   Intimate Partner Violence:   . Fear of Current or Ex-Partner:   . Emotionally Abused:   Marland Kitchen Physically Abused:   . Sexually Abused:     Past Surgical History:  Procedure Laterality Date  . VASECTOMY  2015    Family History  Problem Relation Age of Onset  . Diabetes Paternal Grandmother   . Hypertension Father     Allergies  Allergen Reactions  . Penicillins Other (See Comments)    Unknown reaction    Current Outpatient Medications on File Prior to Visit  Medication Sig Dispense Refill  . benzonatate (TESSALON PERLES) 100 MG capsule Take 1-2 capsules (100-200 mg total) by mouth every 8 (eight) hours as needed  for cough. (Patient not taking: Reported on 01/21/2020) 30 capsule 0  . doxycycline (VIBRA-TABS) 100 MG tablet Take 1 tablet (100 mg total) by mouth 2 (two) times daily. (Patient not taking: Reported on 01/21/2020) 14 tablet 0   No current facility-administered medications on file prior to visit.    BP 120/80 (BP Location: Left Arm, Patient Position: Sitting, Cuff Size: Normal)   Pulse 74   Resp 18   Ht 6' (1.829 m)   Wt 202 lb (91.6 kg)   SpO2 98%   BMI 27.40 kg/m       Objective:   Physical Exam  General Mental Status- Alert. General Appearance- Not in acute distress.   Skin General: Color- Normal Color. Moisture- Normal  Moisture. Rt upper back moderate large mole irregular center.  Neck Carotid Arteries- Normal color. Moisture- Normal Moisture. No carotid bruits. No JVD.  Chest and Lung Exam Auscultation: Breath Sounds:-Normal.  Cardiovascular Auscultation:Rythm- Regular. Murmurs & Other Heart Sounds:Auscultation of the heart reveals- No Murmurs.  Abdomen Inspection:-Inspeection Normal. Palpation/Percussion:Note:No mass. Palpation and Percussion of the abdomen reveal- Non Tender, Non Distended + BS, no rebound or guarding.  Neurologic Cranial Nerve exam:- CN III-XII intact(No nystagmus), symmetric smile. Strength:- 5/5 equal and symmetric strength both upper and lower extremities.  Genital- normal. No testicle tenderness. Normal testicles. No hernia felt in inguinal canals.       Assessment & Plan:  For you wellness exam today I have ordered cbc, cmp and lipid panel.  Vaccine appear up to date. Pt states decided not to get covid vaccine.  Recommend exercise and healthy diet.  We will let you know lab results as they come in.  Follow up date appointment will be determined after lab review.   Counseled on healthy diet and meds that could try occasional immodium if need for loose stools post meals. Eat low fat/greasy diet.  Esperanza Richters, PA-C

## 2020-01-21 NOTE — Patient Instructions (Addendum)
For you wellness exam today I have ordered cbc, cmp and lipid panel.  Vaccine appear up to date. Pt states decided not to get covid vaccine.  Recommend exercise and healthy diet.  We will let you know lab results as they come in.  Follow up date appointment will be determined after lab review.   Counseled on healthy diet and meds that could try occasional immodium if need for loose stools post meals. Eat low fat/greasy diet.  Refer to dermatologist.    Preventive Care 37-9 Years Old, Male Preventive care refers to lifestyle choices and visits with your health care provider that can promote health and wellness. This includes:  A yearly physical exam. This is also called an annual well check.  Regular dental and eye exams.  Immunizations.  Screening for certain conditions.  Healthy lifestyle choices, such as eating a healthy diet, getting regular exercise, not using drugs or products that contain nicotine and tobacco, and limiting alcohol use. What can I expect for my preventive care visit? Physical exam Your health care provider will check:  Height and weight. These may be used to calculate body mass index (BMI), which is a measurement that tells if you are at a healthy weight.  Heart rate and blood pressure.  Your skin for abnormal spots. Counseling Your health care provider may ask you questions about:  Alcohol, tobacco, and drug use.  Emotional well-being.  Home and relationship well-being.  Sexual activity.  Eating habits.  Work and work Statistician. What immunizations do I need?  Influenza (flu) vaccine  This is recommended every year. Tetanus, diphtheria, and pertussis (Tdap) vaccine  You may need a Td booster every 10 years. Varicella (chickenpox) vaccine  You may need this vaccine if you have not already been vaccinated. Human papillomavirus (HPV) vaccine  If recommended by your health care provider, you may need three doses over 6  months. Measles, mumps, and rubella (MMR) vaccine  You may need at least one dose of MMR. You may also need a second dose. Meningococcal conjugate (MenACWY) vaccine  One dose is recommended if you are 37-77 years old and a Market researcher living in a residence hall, or if you have one of several medical conditions. You may also need additional booster doses. Pneumococcal conjugate (PCV13) vaccine  You may need this if you have certain conditions and were not previously vaccinated. Pneumococcal polysaccharide (PPSV23) vaccine  You may need one or two doses if you smoke cigarettes or if you have certain conditions. Hepatitis A vaccine  You may need this if you have certain conditions or if you travel or work in places where you may be exposed to hepatitis A. Hepatitis B vaccine  You may need this if you have certain conditions or if you travel or work in places where you may be exposed to hepatitis B. Haemophilus influenzae type b (Hib) vaccine  You may need this if you have certain risk factors. You may receive vaccines as individual doses or as more than one vaccine together in one shot (combination vaccines). Talk with your health care provider about the risks and benefits of combination vaccines. What tests do I need? Blood tests  Lipid and cholesterol levels. These may be checked every 5 years starting at age 37.  Hepatitis C test.  Hepatitis B test. Screening   Diabetes screening. This is done by checking your blood sugar (glucose) after you have not eaten for a while (fasting).  Sexually transmitted disease (STD) testing. Talk  with your health care provider about your test results, treatment options, and if necessary, the need for more tests. Follow these instructions at home: Eating and drinking   Eat a diet that includes fresh fruits and vegetables, whole grains, lean protein, and low-fat dairy products.  Take vitamin and mineral supplements as  recommended by your health care provider.  Do not drink alcohol if your health care provider tells you not to drink.  If you drink alcohol: ? Limit how much you have to 0-2 drinks a day. ? Be aware of how much alcohol is in your drink. In the [place] one drink equals one 12 oz bottle of beer (355 mL), one 5 oz glass of wine (148 mL), or one 1 oz glass of hard liquor (44 mL). Lifestyle  Take daily care of your teeth and gums.  Stay active. Exercise for at least 30 minutes on 5 or more days each week.  Do not use any products that contain nicotine or tobacco, such as cigarettes, e-cigarettes, and chewing tobacco. If you need help quitting, ask your health care provider.  If you are sexually active, practice safe sex. Use a condom or other form of protection to prevent STIs (sexually transmitted infections). What's next?  Go to your health care provider once a year for a well check visit.  Ask your health care provider how often you should have your eyes and teeth checked.  Stay up to date on all vaccines. This information is not intended to replace advice given to you by your health care provider. Make sure you discuss any questions you have with your health care provider. Document Revised: 06/18/2018 Document Reviewed: 06/18/2018 Elsevier Patient Education  2020 [employer].

## 2022-01-25 ENCOUNTER — Ambulatory Visit (INDEPENDENT_AMBULATORY_CARE_PROVIDER_SITE_OTHER): Payer: 59 | Admitting: Medical

## 2022-01-25 ENCOUNTER — Encounter: Payer: Self-pay | Admitting: Medical

## 2022-01-25 VITALS — BP 120/70 | HR 53 | Temp 98.0°F | Resp 18 | Ht 72.0 in | Wt 204.6 lb

## 2022-01-25 DIAGNOSIS — Z0000 Encounter for general adult medical examination without abnormal findings: Secondary | ICD-10-CM | POA: Diagnosis not present

## 2022-01-25 DIAGNOSIS — Z125 Encounter for screening for malignant neoplasm of prostate: Secondary | ICD-10-CM

## 2022-01-25 LAB — COMPREHENSIVE METABOLIC PANEL
ALT: 22 U/L (ref 0–53)
AST: 17 U/L (ref 0–37)
Albumin: 5.1 g/dL (ref 3.5–5.2)
Alkaline Phosphatase: 72 U/L (ref 39–117)
BUN: 12 mg/dL (ref 6–23)
CO2: 30 mEq/L (ref 19–32)
Calcium: 10 mg/dL (ref 8.4–10.5)
Chloride: 103 mEq/L (ref 96–112)
Creatinine, Ser: 1.08 mg/dL (ref 0.40–1.50)
GFR: 86.68 mL/min (ref >60.00)
Glucose, Bld: 96 mg/dL (ref 70–99)
Potassium: 4.5 mEq/L (ref 3.5–5.1)
Sodium: 140 mEq/L (ref 135–145)
Total Bilirubin: 0.6 mg/dL (ref 0.2–1.2)
Total Protein: 7.5 g/dL (ref 6.0–8.3)

## 2022-01-25 LAB — CBC WITH DIFFERENTIAL/PLATELET
Basophils Absolute: 0.1 10*3/uL (ref 0.0–0.1)
Basophils Relative: 0.8 % (ref 0.0–3.0)
Eosinophils Absolute: 0.3 10*3/uL (ref 0.0–0.7)
Eosinophils Relative: 4.7 % (ref 0.0–5.0)
HCT: 47.7 % (ref 39.0–52.0)
Hemoglobin: 15.6 g/dL (ref 13.0–17.0)
Lymphocytes Relative: 29.7 % (ref 12.0–46.0)
Lymphs Abs: 2.1 10*3/uL (ref 0.7–4.0)
MCHC: 32.7 g/dL (ref 30.0–36.0)
MCV: 85.2 fl (ref 78.0–100.0)
Monocytes Absolute: 0.6 10*3/uL (ref 0.1–1.0)
Monocytes Relative: 8.1 % (ref 3.0–12.0)
Neutro Abs: 4 10*3/uL (ref 1.4–7.7)
Neutrophils Relative %: 56.7 % (ref 43.0–77.0)
Platelets: 249 10*3/uL (ref 150.0–400.0)
RBC: 5.6 Mil/uL (ref 4.22–5.81)
RDW: 14 % (ref 11.5–15.5)
WBC: 7.1 10*3/uL (ref 4.0–10.5)

## 2022-01-25 LAB — LIPID PANEL
Cholesterol: 166 mg/dL (ref 0–200)
HDL: 32.5 mg/dL — ABNORMAL LOW (ref >39.00)
LDL Cholesterol: 107 mg/dL — ABNORMAL HIGH (ref 0–99)
NonHDL: 133.74
Total CHOL/HDL Ratio: 5
Triglycerides: 135 mg/dL (ref 0.0–149.0)
VLDL: 27 mg/dL (ref 0.0–40.0)

## 2022-01-25 LAB — PSA: PSA: 0.8 ng/mL (ref 0.10–4.00)

## 2022-01-25 NOTE — Patient Instructions (Addendum)
For you wellness exam today I have ordered cbc and lipid panel.  Vaccine up to date.  Recommend exercise and healthy diet.  We will let you know lab results as they come in.  Follow up date appointment will be determined after lab review.     Preventive Care 96-39 Years Old, Male Preventive care refers to lifestyle choices and visits with your health care provider that can promote health and wellness. Preventive care visits are also called wellness exams. What can I expect for my preventive care visit? Counseling During your preventive care visit, your health care provider may ask about your: Medical history, including: Past medical problems. Family medical history. Current health, including: Emotional well-being. Home life and relationship well-being. Sexual activity. Lifestyle, including: Alcohol, nicotine or tobacco, and drug use. Access to firearms. Diet, exercise, and sleep habits. Safety issues such as seatbelt and bike helmet use. Sunscreen use. Work and work Astronomer. Physical exam Your health care provider may check your: Height and weight. These may be used to calculate your BMI (body mass index). BMI is a measurement that tells if you are at a healthy weight. Waist circumference. This measures the distance around your waistline. This measurement also tells if you are at a healthy weight and may help predict your risk of certain diseases, such as type 2 diabetes and high blood pressure. Heart rate and blood pressure. Body temperature.  Vaccines are usually given at various ages, according to a schedule. Your health care provider will recommend vaccines for you based on your age, medical history, and lifestyle or other factors, such as travel or where you work. What tests do I need? Screening Your health care provider may recommend screening tests for certain conditions. This may include: Lipid and cholesterol levels. Diabetes screening. This is done by checking  your blood sugar (glucose) after you have not eaten for a while (fasting). Hepatitis B test. Hepatitis C test. HIV (human immunodeficiency virus) test. STI (sexually transmitted infection) testing, if you are at risk. Talk with your health care provider about your test results, treatment options, and if necessary, the need for more tests. Follow these instructions at home: Eating and drinking  Eat a healthy diet that includes fresh fruits and vegetables, whole grains, lean protein, and low-fat dairy products. Drink enough fluid to keep your urine pale yellow. Take vitamin and mineral supplements as recommended by your health care provider. Do not drink alcohol if your health care provider tells you not to drink. If you drink alcohol: Limit how much you have to 0-2 drinks a day. Know how much alcohol is in your drink. In the [place] one drink equals one 12 oz bottle of beer (355 mL), one 5 oz glass of wine (148 mL), or one 1 oz glass of hard liquor (44 mL). Lifestyle Brush your teeth every morning and night with fluoride toothpaste. Floss one time each day. Exercise for at least 30 minutes 5 or more days each week. Do not use any products that contain nicotine or tobacco. These products include cigarettes, chewing tobacco, and vaping devices, such as e-cigarettes. If you need help quitting, ask your health care provider. Do not use drugs. If you are sexually active, practice safe sex. Use a condom or other form of protection to prevent STIs. Find healthy ways to manage stress, such as: Meditation, yoga, or listening to music. Journaling. Talking to a trusted person. Spending time with friends and family. Minimize exposure to UV radiation to reduce your risk of  skin cancer. Safety Always wear your seat belt while driving or riding in a vehicle. Do not drive: If you have been drinking alcohol. Do not ride with someone who has been drinking. If you have been using any mind-altering  substances or drugs. While texting. When you are tired or distracted. Wear a helmet and other protective equipment during sports activities. If you have firearms in your house, make sure you follow all gun safety procedures. Seek help if you have been physically or sexually abused. What's next? Go to your health care provider once a year for an annual wellness visit. Ask your health care provider how often you should have your eyes and teeth checked. Stay up to date on all vaccines. This information is not intended to replace advice given to you by your health care provider. Make sure you discuss any questions you have with your health care provider. Document Revised: 12/20/2020 Document Reviewed: 12/20/2020 Elsevier Patient Education  2023 [employer].

## 2022-01-25 NOTE — Addendum Note (Signed)
Addended by: Gwenevere Abbot on: 01/25/2022 08:40 AM   Modules accepted: Orders

## 2022-01-25 NOTE — Progress Notes (Signed)
Subjective:    Patient ID: Shanda Bumps, male    DOB: Nov 18, 1980, 41 y.o.   MRN: [id number]  HPI  Pt works on generators, No exercise, occasional soda but  less than before, eats out a lot at lunch, married- 2 children., Non smoker and no alcohol.   Up to date on vaccines.   Review of Systems  Constitutional:  Negative for chills, fatigue and fever.  HENT:  Negative for congestion, ear discharge, ear pain and facial swelling.   Respiratory:  Negative for cough, chest tightness, shortness of breath and wheezing.   Cardiovascular:  Negative for chest pain and palpitations.  Gastrointestinal:  Negative for abdominal pain, anal bleeding, constipation and nausea.  Genitourinary:  Negative for dysuria, flank pain and frequency.  Musculoskeletal:  Negative for back pain, joint swelling, myalgias and neck pain.  Skin:  Negative for rash.  Neurological:  Negative for facial asymmetry, light-headedness and headaches.  Hematological:  Negative for adenopathy. Does not bruise/bleed easily.  Psychiatric/Behavioral:  Negative for behavioral problems, confusion, dysphoric mood, hallucinations and suicidal ideas. The patient is not nervous/anxious.     Past Medical History:  Diagnosis Date   Chicken pox    History of fainting spells of unknown cause      Social History   Socioeconomic History   Marital status: Married    Spouse name: Not on file   Number of children: Not on file   Years of education: Not on file   Highest education level: Not on file  Occupational History   Occupation: Pharmacist, hospital  Tobacco Use   Smoking status: Never   Smokeless tobacco: Never  Substance and Sexual Activity   Alcohol use: No    Alcohol/week: 0.0 standard drinks of alcohol   Drug use: No   Sexual activity: Yes    Partners: Female  Other Topics Concern   Not on file  Social History Narrative   Not on file   Social Determinants of Health   Financial Resource Strain: Not on file  Food  Insecurity: Not on file  Transportation Needs: Not on file  Physical Activity: Not on file  Stress: Not on file  Social Connections: Not on file  Intimate Partner Violence: Not on file    Past Surgical History:  Procedure Laterality Date   VASECTOMY  2015    Family History  Problem Relation Age of Onset   Diabetes Paternal Grandmother    Hypertension Father     Allergies  Allergen Reactions   Penicillins Other (See Comments)    Unknown reaction    No current outpatient medications on file prior to visit.   No current facility-administered medications on file prior to visit.    BP 140/68   Pulse (!) 53   Temp 98 F (36.7 C)   Resp 18   Ht 6' (1.829 m)   Wt 204 lb 9.6 oz (92.8 kg)   SpO2 100%   BMI 27.75 kg/m        Objective:   Physical Exam  General Mental Status- Alert. General Appearance- Not in acute distress.   Skin On review/scan no worrisome lesions.  Neck Carotid Arteries- Normal color. Moisture- Normal Moisture. No carotid bruits. No JVD.  Chest and Lung Exam Auscultation: Breath Sounds:-Normal.  Cardiovascular Auscultation:Rythm- Regular. Murmurs & Other Heart Sounds:Auscultation of the heart reveals- No Murmurs.  Abdomen Inspection:-Inspeection Normal. Palpation/Percussion:Note:No mass. Palpation and Percussion of the abdomen reveal- Non Tender, Non Distended + BS, no rebound or guarding.  Neurologic Cranial Nerve exam:- CN III-XII intact(No nystagmus), symmetric smile. Strength:- 5/5 equal and symmetric strength both upper and lower extremities.       Assessment & Plan:   Patient Instructions  For you wellness exam today I have ordered cbc and lipid panel.  Vaccine up to date.  Recommend exercise and healthy diet.  We will let you know lab results as they come in.  Follow up date appointment will be determined after lab review.      Esperanza Richters, PA-C

## 2022-09-25 ENCOUNTER — Encounter: Payer: Self-pay | Admitting: Medical

## 2022-10-03 ENCOUNTER — Ambulatory Visit: Payer: 59 | Admitting: Medical

## 2022-10-11 ENCOUNTER — Ambulatory Visit: Payer: 59 | Admitting: Medical

## 2022-10-11 ENCOUNTER — Encounter: Payer: Self-pay | Admitting: Gastroenterology

## 2022-10-11 VITALS — BP 122/72 | HR 60 | Temp 98.0°F | Resp 18 | Ht 72.0 in | Wt 206.0 lb

## 2022-10-11 DIAGNOSIS — R1084 Generalized abdominal pain: Secondary | ICD-10-CM

## 2022-10-11 LAB — COMPREHENSIVE METABOLIC PANEL
ALT: 32 U/L (ref 0–53)
AST: 20 U/L (ref 0–37)
Albumin: 5 g/dL (ref 3.5–5.2)
Alkaline Phosphatase: 75 U/L (ref 39–117)
BUN: 14 mg/dL (ref 6–23)
CO2: 28 mEq/L (ref 19–32)
Calcium: 9.8 mg/dL (ref 8.4–10.5)
Chloride: 105 mEq/L (ref 96–112)
Creatinine, Ser: 1.1 mg/dL (ref 0.40–1.50)
GFR: 84.37 mL/min (ref >60.00)
Glucose, Bld: 93 mg/dL (ref 70–99)
Potassium: 4.2 mEq/L (ref 3.5–5.1)
Sodium: 139 mEq/L (ref 135–145)
Total Bilirubin: 0.4 mg/dL (ref 0.2–1.2)
Total Protein: 7.3 g/dL (ref 6.0–8.3)

## 2022-10-11 LAB — CBC WITH DIFFERENTIAL/PLATELET
Basophils Absolute: 0.1 10*3/uL (ref 0.0–0.1)
Basophils Relative: 1 % (ref 0.0–3.0)
Eosinophils Absolute: 0.3 10*3/uL (ref 0.0–0.7)
Eosinophils Relative: 3.8 % (ref 0.0–5.0)
HCT: 47.4 % (ref 39.0–52.0)
Hemoglobin: 15.9 g/dL (ref 13.0–17.0)
Lymphocytes Relative: 31.3 % (ref 12.0–46.0)
Lymphs Abs: 2.1 10*3/uL (ref 0.7–4.0)
MCHC: 33.6 g/dL (ref 30.0–36.0)
MCV: 84.2 fl (ref 78.0–100.0)
Monocytes Absolute: 0.6 10*3/uL (ref 0.1–1.0)
Monocytes Relative: 8.5 % (ref 3.0–12.0)
Neutro Abs: 3.8 10*3/uL (ref 1.4–7.7)
Neutrophils Relative %: 55.4 % (ref 43.0–77.0)
Platelets: 244 10*3/uL (ref 150.0–400.0)
RBC: 5.63 Mil/uL (ref 4.22–5.81)
RDW: 13.4 % (ref 11.5–15.5)
WBC: 6.8 10*3/uL (ref 4.0–10.5)

## 2022-10-11 LAB — LIPASE: Lipase: 26 U/L (ref 11.0–59.0)

## 2022-10-11 MED ORDER — CIPROFLOXACIN HCL 500 MG PO TABS: 500.0000 mg | ORAL_TABLET | Freq: Two times a day (BID) | ORAL | 0 refills | Status: DC

## 2022-10-11 MED ORDER — FAMOTIDINE 20 MG PO TABS: 20.0000 mg | ORAL_TABLET | Freq: Every day | ORAL | 0 refills | Status: DC

## 2022-10-11 MED ORDER — METRONIDAZOLE 500 MG PO TABS: 500.0000 mg | ORAL_TABLET | Freq: Three times a day (TID) | ORAL | 0 refills | Status: DC

## 2022-10-11 NOTE — Progress Notes (Signed)
Subjective:    Patient ID: Shanda Bumps, male    DOB: [birth year], 42 y.o.   MRN: [id number]  HPI  Pt in with some recent GI issues.  Pt states he has various intermittent days of abdomen discomfort and bloating. He states a lot. Describes some pain at times after eating.    Pt states has had sypmtoms for about 10 years. He states seemed to get progressley worse after having his gall bladder removed.   Pt talked to friend and he thinks may SIBO.  Pt states probiotic has seemed to help.   He states often he has to use bathroom/have bm one hour after eating. Some days has formed stool other day looser stool.  No fever, no chills, no nausea or vomiting. He does then say if he does not eat sometimes will get nausea.   He states has tried some fiber tablets but he thought made him worse.  Pt does mention that his dad had ulcerative colitis. Dx in his 85's.     Review of Systems  Constitutional:  Negative for chills, fatigue and fever.  Respiratory:  Negative for cough, chest tightness, shortness of breath and wheezing.   Cardiovascular:  Negative for chest pain and palpitations.  Gastrointestinal:  Positive for abdominal pain. Negative for blood in stool, diarrhea and nausea.       See hpi. Presenlty some mlid llq pain on exam.   Genitourinary:  Negative for dysuria, frequency, hematuria, penile pain and urgency.  Musculoskeletal:  Negative for back pain.  Neurological:  Negative for dizziness, seizures and headaches.  Hematological:  Negative for adenopathy. Does not bruise/bleed easily.  Psychiatric/Behavioral:  Negative for sleep disturbance. The patient is not nervous/anxious.     Past Medical History:  Diagnosis Date   Chicken pox    History of fainting spells of unknown cause      Social History   Socioeconomic History   Marital status: Married    Spouse name: Not on file   Number of children: Not on file   Years of education: Not on file   Highest education  level: Not on file  Occupational History   Occupation: Pharmacist, hospital  Tobacco Use   Smoking status: Never   Smokeless tobacco: Never  Substance and Sexual Activity   Alcohol use: No    Alcohol/week: 0.0 standard drinks of alcohol   Drug use: No   Sexual activity: Yes    Partners: Female  Other Topics Concern   Not on file  Social History Narrative   Not on file   Social Determinants of Health   Financial Resource Strain: Not on file  Food Insecurity: Not on file  Transportation Needs: Not on file  Physical Activity: Not on file  Stress: Not on file  Social Connections: Not on file  Intimate Partner Violence: Not on file    Past Surgical History:  Procedure Laterality Date   VASECTOMY  2015    Family History  Problem Relation Age of Onset   Diabetes Paternal Grandmother    Hypertension Father     Allergies  Allergen Reactions   Penicillins Other (See Comments)    Unknown reaction    No current outpatient medications on file prior to visit.   No current facility-administered medications on file prior to visit.    BP 122/72   Pulse 60   Temp 98 F (36.7 C)   Resp 18   Ht 6' (1.829 m)   Wt 206 lb (  93.4 kg)   SpO2 99%   BMI 27.94 kg/m        Objective:   Physical Exam  General- No acute distress. Pleasant patient. Neck- Full range of motion, no jvd Lungs- Clear, even and unlabored. Heart- regular rate and rhythm. Neurologic- CNII- XII grossly intact.  Abdomen- soft, nd, +bs, no rebound. But some faint left lower quadrant tenderness to palpation.       Assessment & Plan:   Patient Instructions  1. Generalized abdominal pain - CBC with Differential/Platelet - Comprehensive metabolic panel - H. pylori breath test - Lipase   Intermittent diffuse abdomen  preceding desire to have bm sporadically.   Will place labs today. Cbc, cmp, lipase and h pylori breath test.  Decided to go ahead and place referral to GI MD. With your dad hx of UC and  your duration of symptom will refer you to GI MD.   Presently continue probiotic and will rx famotadine pending labs and referral.  Follow up date to be determined after lab review.   Esperanza RichtersEdward Kasiah Manka, PA-C

## 2022-10-11 NOTE — Patient Instructions (Addendum)
1. Generalized abdominal pain - CBC with Differential/Platelet - Comprehensive metabolic panel - H. pylori breath test - Lipase   Intermittent diffuse abdomen  preceding desire to have bm sporadically.   Will place labs today. Cbc, cmp, lipase and h pylori breath test.  Decided to go ahead and place referral to GI MD. With your dad hx of UC and your duration of symptom will refer you to GI MD.   Presently continue probiotic and will rx famotadine pending labs and referral.  Some llq faint tenderness to palpation. Will follow labs. If that area were to become constant over weekend then can start flagyl and cipro  Follow up date to be determined after lab review.

## 2022-10-16 LAB — H. PYLORI BREATH TEST: H. pylori Breath Test: DETECTED — AB

## 2022-10-17 ENCOUNTER — Other Ambulatory Visit (HOSPITAL_BASED_OUTPATIENT_CLINIC_OR_DEPARTMENT_OTHER): Payer: Self-pay

## 2022-10-17 ENCOUNTER — Encounter: Payer: Self-pay | Admitting: Medical

## 2022-10-17 ENCOUNTER — Other Ambulatory Visit: Payer: Self-pay | Admitting: Medical

## 2022-10-17 MED ORDER — TETRACYCLINE HCL 500 MG PO TABS: ORAL_TABLET | ORAL | 0 refills | Status: DC

## 2022-10-17 MED ORDER — BISMUTH SUBSALICYLATE 262 MG PO CHEW: 262.0000 mg | CHEWABLE_TABLET | ORAL | 0 refills | Status: AC | PRN

## 2022-10-17 MED ORDER — METRONIDAZOLE 250 MG PO TABS: ORAL_TABLET | ORAL | 0 refills | Status: AC

## 2022-10-17 MED ORDER — OMEPRAZOLE 40 MG PO CPDR: 40.0000 mg | DELAYED_RELEASE_CAPSULE | Freq: Every day | ORAL | 0 refills | Status: DC

## 2022-10-17 MED ORDER — TETRACYCLINE HCL 500 MG PO CAPS: 500.0000 mg | ORAL_CAPSULE | Freq: Four times a day (QID) | ORAL | 0 refills | Status: AC

## 2022-10-17 MED ORDER — TETRACYCLINE HCL 500 MG PO CAPS
500.0000 mg | ORAL_CAPSULE | Freq: Four times a day (QID) | ORAL | 0 refills | Status: DC
  Filled 2022-10-17: qty 40, 10d supply, fill #0

## 2022-10-17 NOTE — Addendum Note (Signed)
Addended by: Gwenevere Abbot on: 10/17/2022 06:33 PM   Modules accepted: Orders

## 2022-10-17 NOTE — Addendum Note (Signed)
Addended by: Gwenevere Abbot on: 10/17/2022 06:59 AM   Modules accepted: Orders

## 2022-10-17 NOTE — Telephone Encounter (Signed)
  Pharmacy comment: Alternative Requested:PLEASE CHANGE TO CAPSULES TABS FOR 2000 DOLLARS

## 2022-10-17 NOTE — Addendum Note (Signed)
Addended by: Gwenevere Abbot on: 10/17/2022 05:51 PM   Modules accepted: Orders

## 2022-11-07 ENCOUNTER — Other Ambulatory Visit: Payer: Self-pay | Admitting: Medical

## 2022-11-15 ENCOUNTER — Ambulatory Visit: Payer: 59 | Admitting: Medical

## 2022-11-20 ENCOUNTER — Other Ambulatory Visit: Payer: Self-pay | Admitting: Medical

## 2022-11-22 ENCOUNTER — Ambulatory Visit (INDEPENDENT_AMBULATORY_CARE_PROVIDER_SITE_OTHER): Payer: 59 | Admitting: Medical

## 2022-11-22 VITALS — BP 118/66 | HR 49 | Temp 98.0°F | Resp 18 | Ht 72.0 in | Wt 209.8 lb

## 2022-11-22 DIAGNOSIS — R1013 Epigastric pain: Secondary | ICD-10-CM

## 2022-11-22 DIAGNOSIS — R5383 Other fatigue: Secondary | ICD-10-CM | POA: Diagnosis not present

## 2022-11-22 LAB — CBC WITH DIFFERENTIAL/PLATELET
Basophils Absolute: 0.1 10*3/uL (ref 0.0–0.1)
Basophils Relative: 0.7 % (ref 0.0–3.0)
Eosinophils Absolute: 0.1 10*3/uL (ref 0.0–0.7)
Eosinophils Relative: 1.5 % (ref 0.0–5.0)
HCT: 46.6 % (ref 39.0–52.0)
Hemoglobin: 15.6 g/dL (ref 13.0–17.0)
Lymphocytes Relative: 32.6 % (ref 12.0–46.0)
Lymphs Abs: 2.3 10*3/uL (ref 0.7–4.0)
MCHC: 33.6 g/dL (ref 30.0–36.0)
MCV: 85 fl (ref 78.0–100.0)
Monocytes Absolute: 0.7 10*3/uL (ref 0.1–1.0)
Monocytes Relative: 10 % (ref 3.0–12.0)
Neutro Abs: 3.9 10*3/uL (ref 1.4–7.7)
Neutrophils Relative %: 55.2 % (ref 43.0–77.0)
Platelets: 253 10*3/uL (ref 150.0–400.0)
RBC: 5.48 Mil/uL (ref 4.22–5.81)
RDW: 13.6 % (ref 11.5–15.5)
WBC: 7.1 10*3/uL (ref 4.0–10.5)

## 2022-11-22 LAB — VITAMIN B12: Vitamin B-12: 278 pg/mL (ref 211–911)

## 2022-11-22 LAB — IRON: Iron: 110 ug/dL (ref 42–165)

## 2022-11-22 LAB — T4, FREE: Free T4: 0.8 ng/dL (ref 0.60–1.60)

## 2022-11-22 LAB — TSH: TSH: 1.21 u[IU]/mL (ref 0.35–5.50)

## 2022-11-22 MED ORDER — OMEPRAZOLE 40 MG PO CPDR: 40.0000 mg | DELAYED_RELEASE_CAPSULE | Freq: Every day | ORAL | 0 refills | Status: AC

## 2022-11-22 NOTE — Progress Notes (Signed)
Subjective:    Patient ID: Jeffrey Mckinney, male    DOB: [birth year], 42 y.o.   MRN: [id number]  HPI  Pt in for follow up.  Pt sent me a message that he was having some fatigue. He associated that prilosec early in the day seemed to make him more fatigued than usual. Some better now that he started taking omeprazole at night.   Pt states that his stomach felt better initially after antibiotic tx for h pylori.Marland Kitchen He states recently stomach is starting to feel upset again. He states less gas/belching but having pain after eating/feeling bloated. But no pain presenlty on interview.  Pt has appointment with GI MD on June 14 th.   "Years of intermittent abdomen pain that is diffuse often having to use bathroom associated with discomfort. He states possibly got worse around time gallbladder removed. He notes dad had UC and he thinks may have sibo. Recently some response to probiotics. Getting cbc, cmp, lipase and h pylori breath test today. Evaluate and treat.   Considered ibs. But if has would be mixed pattern."    Review of Systems  Constitutional:  Negative for chills, diaphoresis and fatigue.  HENT:  Negative for congestion and ear discharge.   Respiratory:  Negative for cough, chest tightness and stridor.   Cardiovascular:  Negative for chest pain and palpitations.  Gastrointestinal:  Positive for abdominal distention. Negative for constipation, nausea and vomiting.  Endocrine: Negative for polydipsia, polyphagia and polyuria.  Genitourinary:  Negative for dysuria.  Musculoskeletal:  Negative for back pain and myalgias.  Skin:  Negative for rash.  Neurological:  Negative for dizziness, speech difficulty and light-headedness.  Hematological:  Negative for adenopathy. Does not bruise/bleed easily.  Psychiatric/Behavioral:  Negative for behavioral problems and dysphoric mood.    Past Medical History:  Diagnosis Date   Chicken pox    History of fainting spells of unknown cause       Social History   Socioeconomic History   Marital status: Married    Spouse name: Not on file   Number of children: Not on file   Years of education: Not on file   Highest education level: Not on file  Occupational History   Occupation: Pharmacist, hospital  Tobacco Use   Smoking status: Never   Smokeless tobacco: Never  Substance and Sexual Activity   Alcohol use: No    Alcohol/week: 0.0 standard drinks of alcohol   Drug use: No   Sexual activity: Yes    Partners: Female  Other Topics Concern   Not on file  Social History Narrative   Not on file   Social Determinants of Health   Financial Resource Strain: Not on file  Food Insecurity: Not on file  Transportation Needs: Not on file  Physical Activity: Not on file  Stress: Not on file  Social Connections: Not on file  Intimate Partner Violence: Not on file    Past Surgical History:  Procedure Laterality Date   VASECTOMY  2015    Family History  Problem Relation Age of Onset   Diabetes Paternal Grandmother    Hypertension Father     Allergies  Allergen Reactions   Penicillins Other (See Comments)    Unknown reaction    Current Outpatient Medications on File Prior to Visit  Medication Sig Dispense Refill   bismuth subsalicylate (PEPTO-BISMOL) 262 MG chewable tablet Chew 1 tablet (262 mg total) by mouth as needed. 10 tablet 0   famotidine (PEPCID) 20 MG tablet  TAKE 1 TABLET BY MOUTH EVERY DAY 90 tablet 1   metroNIDAZOLE (FLAGYL) 250 MG tablet 1 tab po 4 times daily. 40 tablet 0   omeprazole (PRILOSEC) 40 MG capsule Take 1 capsule (40 mg total) by mouth daily. 90 capsule 0   No current facility-administered medications on file prior to visit.    BP 118/66   Pulse (!) 49   Temp 98 F (36.7 C)   Resp 18   Ht 6' (1.829 m)   Wt 209 lb 12.8 oz (95.2 kg)   SpO2 100%   BMI 28.45 kg/m         Objective:   Physical Exam  General Mental Status- Alert. General Appearance- Not in acute distress.    Skin General: Color- Normal Color. Moisture- Normal Moisture.  Neck Carotid Arteries- Normal color. Moisture- Normal Moisture. No carotid bruits. No JVD.  Chest and Lung Exam Auscultation: Breath Sounds:-Normal.  Cardiovascular Auscultation:Rythm- Regular. Murmurs & Other Heart Sounds:Auscultation of the heart reveals- No Murmurs.  Abdomen Inspection:-Inspeection Normal. Palpation/Percussion:Note:No mass. Palpation and Percussion of the abdomen reveal- Non Tender, Non Distended + BS, no rebound or guarding.   Neurologic Cranial Nerve exam:- CN III-XII intact(No nystagmus), symmetric smile. Strength:- 5/5 equal and symmetric strength both upper and lower extremities.       Assessment & Plan:   Patient Instructions  1. Fatigue, unspecified type Will do labs to evaluate the fatigue. - TSH - T4, free - B12 - Iron - CBC w/Diff  2. Epigastric pain Continue omeprazole today. Overall better but still having symptoms. Recommend healthy diet. If your pain worsens or changes from baseline then let me know. Keep upcoming gi appointment. They may repeat h pylori test or do egd.   Follow up date to be determined after GI MD evaluation.       Esperanza Richters, PA-C

## 2022-11-22 NOTE — Patient Instructions (Addendum)
1. Fatigue, unspecified type Will do labs to evaluate the fatigue. - TSH - T4, free - B12 - Iron - CBC w/Diff  2. Epigastric pain Continue omeprazole today. Overall better but still having symptoms. Recommend healthy diet. If your pain worsens or changes from baseline then let me know. Keep upcoming gi appointment. They may repeat h pylori test or do egd.   Follow up date to be determined after GI MD evaluation.

## 2022-12-20 ENCOUNTER — Ambulatory Visit: Payer: 59 | Admitting: Gastroenterology

## 2022-12-20 ENCOUNTER — Encounter: Payer: Self-pay | Admitting: Gastroenterology

## 2022-12-20 ENCOUNTER — Other Ambulatory Visit: Payer: 59

## 2022-12-20 VITALS — BP 106/62 | HR 64 | Ht 72.0 in | Wt 204.0 lb

## 2022-12-20 DIAGNOSIS — R197 Diarrhea, unspecified: Secondary | ICD-10-CM

## 2022-12-20 DIAGNOSIS — R1084 Generalized abdominal pain: Secondary | ICD-10-CM | POA: Diagnosis not present

## 2022-12-20 DIAGNOSIS — A048 Other specified bacterial intestinal infections: Secondary | ICD-10-CM

## 2022-12-20 MED ORDER — CHOLESTYRAMINE 4 G PO PACK: 4.0000 g | PACK | Freq: Three times a day (TID) | ORAL | 3 refills | Status: DC

## 2022-12-20 NOTE — Progress Notes (Signed)
HPI : Jeffrey Mckinney is a 42 y.o. male who is referred to [place] by Esperanza Richters, PA-C for further evaluation of chronic abdominal pain and diarrhea with urgency.  The patient states that he has been having issues with diarrhea and urgency for several years, thinks it started around the time he had his gallbladder out.  He describes having sudden urges to defecate associated with periumbilical discomfort and bloating/pressure.  He notes that sitting often seem to trigger the urge to defecate.  He usually has 1 bowel movement per day, but he will have up to 3-4 bowel movements per day some days.  He notes that when he is away from the house, it can be very difficult to get to a bathroom in time.  His stools are usually formed with the first bowel movement, but become progressively looser with subsequent bowel movements.  He denies any blood in the stool.  No nocturnal bowel movements.  No significant abdominal pain other than the bloating/general discomfort.  No nausea or vomiting.  His weight has been stable.  He denies any GERD symptoms.  He has not identified any particular foods that persistently worsen his symptoms.  He was having more significant abdominal pain earlier this year.  He was diagnosed with H. Pylori via a positive breath test on April 5th and treated with quadruple therapy, with some improvement in symptoms.  Initially was completely resolved, but then symptoms then started to come back. He denies NSAID use.  He does not smoke or drink alcohol.  He was being treated with omeprazole, but just discontinued this recently.  Recent Iron, B12, thyroid tests were normal.  His father has ulcerative colitis.  No family history of celiac disease.   Past Medical History:  Diagnosis Date   Chicken pox    History of fainting spells of unknown cause      Past Surgical History:  Procedure Laterality Date   VASECTOMY  2015   Family History  Problem Relation Age of Onset   Diabetes  Paternal Grandmother    Hypertension Father    Social History   Tobacco Use   Smoking status: Never   Smokeless tobacco: Never  Substance Use Topics   Alcohol use: No    Alcohol/week: 0.0 standard drinks of alcohol   Drug use: No   Current Outpatient Medications  Medication Sig Dispense Refill   bismuth subsalicylate (PEPTO-BISMOL) 262 MG chewable tablet Chew 1 tablet (262 mg total) by mouth as needed. 10 tablet 0   famotidine (PEPCID) 20 MG tablet TAKE 1 TABLET BY MOUTH EVERY DAY 90 tablet 1   metroNIDAZOLE (FLAGYL) 250 MG tablet 1 tab po 4 times daily. 40 tablet 0   omeprazole (PRILOSEC) 40 MG capsule Take 1 capsule (40 mg total) by mouth daily. 90 capsule 0   omeprazole (PRILOSEC) 40 MG capsule Take 1 capsule (40 mg total) by mouth daily. 30 capsule 0   No current facility-administered medications for this visit.   Allergies  Allergen Reactions   Penicillins Other (See Comments)    Unknown reaction     Review of Systems: All systems reviewed and negative except where noted in HPI.    No results found.  Physical Exam: BP 106/62   Pulse 64   Ht 6' (1.829 m)   Wt 204 lb (92.5 kg)   BMI 27.67 kg/m  Constitutional: Pleasant,well-developed, Caucasian male in no acute distress. HEENT: Normocephalic and atraumatic. Conjunctivae are normal. No scleral icterus. Neck supple.  Cardiovascular: Normal rate, regular rhythm.  Pulmonary/chest: Effort normal and breath sounds normal. No wheezing, rales or rhonchi. Abdominal: Soft, nondistended, tenderness to palpation in the left lower quadrant without rebound tenderness or guarding.  Bowel sounds active throughout. There are no masses palpable. No hepatomegaly. Extremities: no edema Neurological: Alert and oriented to person place and time. Skin: Skin is warm and dry. No rashes noted. Psychiatric: Normal mood and affect. Behavior is normal.  CBC    Component Value Date/Time   WBC 7.1 11/22/2022 1019   RBC 5.48 11/22/2022  1019   HGB 15.6 11/22/2022 1019   HCT 46.6 11/22/2022 1019   PLT 253.0 11/22/2022 1019   MCV 85.0 11/22/2022 1019   MCHC 33.6 11/22/2022 1019   RDW 13.6 11/22/2022 1019   LYMPHSABS 2.3 11/22/2022 1019   MONOABS 0.7 11/22/2022 1019   EOSABS 0.1 11/22/2022 1019   BASOSABS 0.1 11/22/2022 1019    CMP     Component Value Date/Time   NA 139 10/11/2022 0943   K 4.2 10/11/2022 0943   CL 105 10/11/2022 0943   CO2 28 10/11/2022 0943   GLUCOSE 93 10/11/2022 0943   BUN 14 10/11/2022 0943   CREATININE 1.10 10/11/2022 0943   CREATININE 1.07 07/15/2014 0818   CALCIUM 9.8 10/11/2022 0943   PROT 7.3 10/11/2022 0943   ALBUMIN 5.0 10/11/2022 0943   AST 20 10/11/2022 0943   ALT 32 10/11/2022 0943   ALKPHOS 75 10/11/2022 0943   BILITOT 0.4 10/11/2022 0943   GFRNONAA >89 07/15/2014 0818   GFRAA >89 07/15/2014 0818       Latest Ref Rng & Units 11/22/2022   10:19 AM 10/11/2022    9:43 AM 01/25/2022    8:31 AM  CBC EXTENDED  WBC 4.0 - 10.5 K/uL 7.1  6.8  7.1   RBC 4.22 - 5.81 Mil/uL 5.48  5.63  5.60   Hemoglobin 13.0 - 17.0 g/dL 16.1  09.6  04.5   HCT 39.0 - 52.0 % 46.6  47.4  47.7   Platelets 150.0 - 400.0 K/uL 253.0  244.0  249.0   NEUT# 1.4 - 7.7 K/uL 3.9  3.8  4.0   Lymph# 0.7 - 4.0 K/uL 2.3  2.1  2.1       ASSESSMENT AND PLAN:  42 year old male with chronic history of frequent stools with urgency following his cholecystectomy in 2017.  Also with abdominal pain and found to have H. pylori infection treated with quadruple therapy. Is diarrhea/urgency is most likely related to bile acid diarrhea.  I recommended he start taking cholestyramine every day, increasing to twice daily if well-tolerated.  I also suggest that he take loperamide as needed for when he will not have reliable access to a toilet. Will check celiac serologies.  Although very unlikely to represent inflammatory bowel disease, will check fecal lactoferrin given his family history of ulcerative colitis.  H. pylori  eradication needs to be confirmed.  Will submit stool sample.  Patient is aware he needs to wait 2 weeks from his last dose of omeprazole before submitting stool sample.   Diarrhea/urgency - Cholestyramine - Loperamide PRN - fecal lactoferrin - TTG/IgA  H. Pylori - Stool ag  Otha Monical E. Tomasa Rand, MD [facility]     [place], [place], [place]

## 2022-12-20 NOTE — Patient Instructions (Addendum)
_______________________________________________________  If your blood pressure at your visit was 140/90 or greater, please contact your primary care physician to follow up on this.  _______________________________________________________  If you are age 40 or older, your body mass index should be between 23-30. Your Body mass index is 27.67 kg/m. If this is out of the aforementioned range listed, please consider follow up with your Primary Care Provider.  If you are age 64 or younger, your body mass index should be between 19-25. Your Body mass index is 27.67 kg/m. If this is out of the aformentioned range listed, please consider follow up with your Primary Care Provider.   We have sent the following medications to your pharmacy for you to pick up at your convenience: Cholestyramine 4 grams daily . Increase twice daily if tolerated.  Your provider has requested that you go to the basement level for lab work before leaving today. Press "B" on the elevator. The lab is located at the first door on the left as you exit the elevator.  Use imodium as needed.  Wait 2 weeks before completing H pylori stool test.  Due to recent changes in healthcare laws, you may see the results of your imaging and laboratory studies on MyChart before your provider has had a chance to review them.  We understand that in some cases there may be results that are confusing or concerning to you. Not all laboratory results come back in the same time frame and the provider may be waiting for multiple results in order to interpret others.  Please give [place] 48 hours in order for your provider to thoroughly review all the results before contacting the office for clarification of your results.    The [facility] providers would like to encourage you to use [facility] to communicate with providers for non-urgent requests or questions.  Due to long hold times on the telephone, sending your provider a message by [facility] may be a faster  and more efficient way to get a response.  Please allow 48 business hours for a response.  Please remember that this is for non-urgent requests.   It was a pleasure to see you today!  Thank you for trusting me with your gastrointestinal care!

## 2022-12-21 LAB — TISSUE TRANSGLUTAMINASE, IGA: (tTG) Ab, IgA: <1 U/mL

## 2022-12-23 LAB — IGA: Immunoglobulin A: 217 mg/dL (ref 47–310)

## 2022-12-24 NOTE — Progress Notes (Signed)
Jeffrey Mckinney,  Your test for celiac disease was normal.  Awaiting your stool study results next

## 2022-12-26 ENCOUNTER — Other Ambulatory Visit: Payer: 59

## 2022-12-26 DIAGNOSIS — R1084 Generalized abdominal pain: Secondary | ICD-10-CM | POA: Diagnosis not present

## 2022-12-26 DIAGNOSIS — R197 Diarrhea, unspecified: Secondary | ICD-10-CM

## 2022-12-26 DIAGNOSIS — A048 Other specified bacterial intestinal infections: Secondary | ICD-10-CM | POA: Diagnosis not present

## 2022-12-27 LAB — FECAL LACTOFERRIN, QUANT
Fecal Lactoferrin: NEGATIVE
MICRO NUMBER:: 15107273
SPECIMEN QUALITY:: ADEQUATE

## 2022-12-28 LAB — H. PYLORI ANTIGEN, STOOL: H pylori Ag, Stl: NEGATIVE

## 2023-01-01 NOTE — Progress Notes (Signed)
Jeffrey Mckinney,  Your H. Pylori stool antigen test was negative (not present) and your fecal lactoferrin was normal, making inflammatory diarrhea very unlikely.  Please continue to take the cholestyramine, adjusting dose as needed and taking loperamide as needed. Follow up with me in the office if no improvement.

## 2023-08-04 ENCOUNTER — Other Ambulatory Visit: Payer: Self-pay | Admitting: Gastroenterology

## 2024-01-06 ENCOUNTER — Other Ambulatory Visit: Payer: Self-pay | Admitting: Gastroenterology

## 2024-01-06 ENCOUNTER — Encounter: Payer: Self-pay | Admitting: Medical

## 2024-05-14 ENCOUNTER — Encounter: Payer: Self-pay | Admitting: Medical

## 2024-05-14 ENCOUNTER — Ambulatory Visit (INDEPENDENT_AMBULATORY_CARE_PROVIDER_SITE_OTHER): Admitting: Medical

## 2024-05-14 VITALS — BP 124/70 | HR 52 | Ht 72.0 in | Wt 214.6 lb

## 2024-05-14 DIAGNOSIS — Z0000 Encounter for general adult medical examination without abnormal findings: Secondary | ICD-10-CM

## 2024-05-14 LAB — COMPREHENSIVE METABOLIC PANEL WITH GFR
ALT: 31 U/L (ref 0–53)
AST: 18 U/L (ref 0–37)
Albumin: 4.9 g/dL (ref 3.5–5.2)
Alkaline Phosphatase: 71 U/L (ref 39–117)
BUN: 12 mg/dL (ref 6–23)
CO2: 29 meq/L (ref 19–32)
Calcium: 9.4 mg/dL (ref 8.4–10.5)
Chloride: 104 meq/L (ref 96–112)
Creatinine, Ser: 1.02 mg/dL (ref 0.40–1.50)
GFR: 91.35 mL/min (ref >60.00)
Glucose, Bld: 92 mg/dL (ref 70–99)
Potassium: 4.1 meq/L (ref 3.5–5.1)
Sodium: 141 meq/L (ref 135–145)
Total Bilirubin: 0.5 mg/dL (ref 0.2–1.2)
Total Protein: 7.2 g/dL (ref 6.0–8.3)

## 2024-05-14 LAB — LIPID PANEL
Cholesterol: 159 mg/dL (ref 0–200)
HDL: 37.9 mg/dL — ABNORMAL LOW (ref >39.00)
LDL Cholesterol: 100 mg/dL — ABNORMAL HIGH (ref 0–99)
NonHDL: 120.78
Total CHOL/HDL Ratio: 4
Triglycerides: 103 mg/dL (ref 0.0–149.0)
VLDL: 20.6 mg/dL (ref 0.0–40.0)

## 2024-05-14 LAB — CBC WITH DIFFERENTIAL/PLATELET
Basophils Absolute: 0.1 K/uL (ref 0.0–0.1)
Basophils Relative: 0.7 % (ref 0.0–3.0)
Eosinophils Absolute: 0.4 K/uL (ref 0.0–0.7)
Eosinophils Relative: 6 % — ABNORMAL HIGH (ref 0.0–5.0)
HCT: 46.4 % (ref 39.0–52.0)
Hemoglobin: 15.2 g/dL (ref 13.0–17.0)
Lymphocytes Relative: 31.5 % (ref 12.0–46.0)
Lymphs Abs: 2.4 K/uL (ref 0.7–4.0)
MCHC: 32.7 g/dL (ref 30.0–36.0)
MCV: 87.1 fl (ref 78.0–100.0)
Monocytes Absolute: 0.6 K/uL (ref 0.1–1.0)
Monocytes Relative: 8.6 % (ref 3.0–12.0)
Neutro Abs: 4 K/uL (ref 1.4–7.7)
Neutrophils Relative %: 53.2 % (ref 43.0–77.0)
Platelets: 257 K/uL (ref 150.0–400.0)
RBC: 5.33 Mil/uL (ref 4.22–5.81)
RDW: 13.7 % (ref 11.5–15.5)
WBC: 7.5 K/uL (ref 4.0–10.5)

## 2024-05-14 NOTE — Progress Notes (Signed)
 Subjective:    Patient ID: Jeffrey Mckinney, male    DOB: 1981/02/25, 43 y.o.   MRN: [id number]  HPI  Pt in for wellness exam. He is fasting.  Pt works on generators, No exercise, occasional soda twice a day as before, eats out a lot at lunch, married- 2 children., Non smoker and no alcohol.     Pt declines flu vaccine. Pt deferes Td presently   No acute problems but update me that he passed kidney stone this summer. Had pain rt flank/cva area.   Review of Systems  Constitutional:  Negative for chills and fatigue.  HENT:  Negative for congestion, ear pain and nosebleeds.   Respiratory:  Negative for chest tightness, shortness of breath and wheezing.   Cardiovascular:  Negative for chest pain and palpitations.  Gastrointestinal:  Negative for abdominal pain, constipation, nausea and vomiting.  Genitourinary:  Negative for dysuria and frequency.  Musculoskeletal:  Negative for back pain, myalgias and neck stiffness.  Skin:  Negative for rash.  Neurological:  Negative for dizziness, weakness and numbness.  Psychiatric/Behavioral:  Negative for behavioral problems and dysphoric mood. The patient is not nervous/anxious.     Past Medical History:  Diagnosis Date   Abdominal pain    Chicken pox    History of fainting spells of unknown cause      Social History   Socioeconomic History   Marital status: Married    Spouse name: Not on file   Number of children: 2   Years of education: Not on file   Highest education level: 12th grade  Occupational History   Occupation: Pharmacist, Hospital   Occupation: generator tech  Tobacco Use   Smoking status: Never   Smokeless tobacco: Never  Vaping Use   Vaping status: Never Used  Substance and Sexual Activity   Alcohol use: No    Alcohol/week: 0.0 standard drinks of alcohol   Drug use: No   Sexual activity: Yes    Partners: Female  Other Topics Concern   Not on file  Social History Narrative   Not on file   Social Drivers of  Health   Financial Resource Strain: Low Risk  (05/13/2024)   Overall Financial Resource Strain (CARDIA)    Difficulty of Paying Living Expenses: Not very hard  Food Insecurity: No Food Insecurity (05/13/2024)   Hunger Vital Sign    Worried About Running Out of Food in the Last Year: Never true    Ran Out of Food in the Last Year: Never true  Transportation Needs: No Transportation Needs (05/13/2024)   PRAPARE - Administrator, Civil Service (Medical): No    Lack of Transportation (Non-Medical): No  Physical Activity: Sufficiently Active (05/13/2024)   Exercise Vital Sign    Days of Exercise per Week: 7 days    Minutes of Exercise per Session: 60 min  Stress: No Stress Concern Present (05/13/2024)   [employer] of Occupational Health - Occupational Stress Questionnaire    Feeling of Stress: Not at all  Social Connections: Moderately Integrated (05/13/2024)   Social Connection and Isolation Panel    Frequency of Communication with Friends and Family: More than three times a week    Frequency of Social Gatherings with Friends and Family: More than three times a week    Attends Religious Services: More than 4 times per year    Active Member of [employer] or Organizations: No    Attends Banker Meetings: Not on file  Marital Status: Married  Catering Manager Violence: Not on file    Past Surgical History:  Procedure Laterality Date   APPENDECTOMY     CHOLECYSTECTOMY     HERNIA MESH REMOVAL     VASECTOMY  07/08/2013    Family History  Problem Relation Age of Onset   Hypertension Father    Diabetes Paternal Grandmother    Stomach cancer Neg Hx    Esophageal cancer Neg Hx    Colon cancer Neg Hx     Allergies  Allergen Reactions   Penicillins Other (See Comments)    Unknown reaction    Current Outpatient Medications on File Prior to Visit  Medication Sig Dispense Refill   bismuth  subsalicylate (PEPTO-BISMOL) 262 MG chewable tablet Chew 1 tablet (262  mg total) by mouth as needed. (Patient not taking: Reported on 12/20/2022) 10 tablet 0   cholestyramine  (QUESTRAN ) 4 g packet TAKE 1 PACKET (4 G TOTAL) BY MOUTH 2 TIMES DAILY. 60 packet 2   famotidine  (PEPCID ) 20 MG tablet TAKE 1 TABLET BY MOUTH EVERY DAY (Patient not taking: Reported on 12/20/2022) 90 tablet 1   metroNIDAZOLE  (FLAGYL ) 250 MG tablet 1 tab po 4 times daily. (Patient not taking: Reported on 12/20/2022) 40 tablet 0   omeprazole  (PRILOSEC) 40 MG capsule Take 1 capsule (40 mg total) by mouth daily. (Patient not taking: Reported on 12/20/2022) 90 capsule 0   omeprazole  (PRILOSEC) 40 MG capsule Take 1 capsule (40 mg total) by mouth daily. (Patient not taking: Reported on 12/20/2022) 30 capsule 0   No current facility-administered medications on file prior to visit.    BP 124/70 (BP Location: Left Arm, Patient Position: Sitting, Cuff Size: Large)   Pulse (!) 52   Ht 6' (1.829 m)   Wt 214 lb 9.6 oz (97.3 kg)   SpO2 98%   BMI 29.10 kg/m         Objective:   Physical Exam  General Mental Status- Alert. General Appearance- Not in acute distress.   Skin General: Color- Normal Color. Moisture- Normal Moisture.  Neck  No JVD.  Chest and Lung Exam Auscultation: Breath Sounds:-CTA  Cardiovascular Auscultation:Rythm- RRR Murmurs & Other Heart Sounds:Auscultation of the heart reveals- No Murmurs.  Abdomen Inspection:-Inspeection Normal. Palpation/Percussion:Note:No mass. Palpation and Percussion of the abdomen reveal- Non Tender, Non Distended + BS, no rebound or guarding.  Neurologic Cranial Nerve exam:- CN III-XII intact(No nystagmus), symmetric smile. Strength:- 5/5 equal and symmetric strength both upper and lower extremities.       Assessment & Plan:   Patient Instructions  For you wellness exam today I have ordered cbc, cmp and lipid panel.  Vaccine declined.  Recommend exercise and healthy diet.  We will let you know lab results as they come in.  Follow  up date appointment will be determined after lab review.        Sundeep Cary, PA-C

## 2024-05-14 NOTE — Patient Instructions (Addendum)
 For you wellness exam today I have ordered cbc, cmp and lipid panel.  Vaccine declined.  Recommend exercise and healthy diet.  We will let you know lab results as they come in.  Follow up date appointment will be determined after lab review.     Preventive Care 20-41 Years Old, Male Preventive care refers to lifestyle choices and visits with your health care provider that can promote health and wellness. Preventive care visits are also called wellness exams. What can I expect for my preventive care visit? Counseling During your preventive care visit, your health care provider may ask about your: Medical history, including: Past medical problems. Family medical history. Current health, including: Emotional well-being. Home life and relationship well-being. Sexual activity. Lifestyle, including: Alcohol, nicotine or tobacco, and drug use. Access to firearms. Diet, exercise, and sleep habits. Safety issues such as seatbelt and bike helmet use. Sunscreen use. Work and work astronomer. Physical exam Your health care provider will check your: Height and weight. These may be used to calculate your BMI (body mass index). BMI is a measurement that tells if you are at a healthy weight. Waist circumference. This measures the distance around your waistline. This measurement also tells if you are at a healthy weight and may help predict your risk of certain diseases, such as type 2 diabetes and high blood pressure. Heart rate and blood pressure. Body temperature. Skin for abnormal spots. What immunizations do I need?  Vaccines are usually given at various ages, according to a schedule. Your health care provider will recommend vaccines for you based on your age, medical history, and lifestyle or other factors, such as travel or where you work. What tests do I need? Screening Your health care provider may recommend screening tests for certain conditions. This may include: Lipid and  cholesterol levels. Diabetes screening. This is done by checking your blood sugar (glucose) after you have not eaten for a while (fasting). Hepatitis B test. Hepatitis C test. HIV (human immunodeficiency virus) test. STI (sexually transmitted infection) testing, if you are at risk. Lung cancer screening. Prostate cancer screening. Colorectal cancer screening. Talk with your health care provider about your test results, treatment options, and if necessary, the need for more tests. Follow these instructions at home: Eating and drinking  Eat a diet that includes fresh fruits and vegetables, whole grains, lean protein, and low-fat dairy products. Take vitamin and mineral supplements as recommended by your health care provider. Do not drink alcohol if your health care provider tells you not to drink. If you drink alcohol: Limit how much you have to 41 drinks a day. Know how much alcohol is in your drink. In the [place] one drink equals one 12 oz bottle of beer (355 mL), one 5 oz glass of wine (148 mL), or one 1 oz glass of hard liquor (44 mL). Lifestyle Brush your teeth every morning and night with fluoride toothpaste. Floss one time each day. Exercise for at least 30 minutes 5 or more days each week. Do not use any products that contain nicotine or tobacco. These products include cigarettes, chewing tobacco, and vaping devices, such as e-cigarettes. If you need help quitting, ask your health care provider. Do not use drugs. If you are sexually active, practice safe sex. Use a condom or other form of protection to prevent STIs. Take aspirin only as told by your health care provider. Make sure that you understand how much to take and what form to take. Work with your health care  provider to find out whether it is safe and beneficial for you to take aspirin daily. Find healthy ways to manage stress, such as: Meditation, yoga, or listening to music. Journaling. Talking to a trusted  person. Spending time with friends and family. Minimize exposure to UV radiation to reduce your risk of skin cancer. Safety Always wear your seat belt while driving or riding in a vehicle. Do not drive: If you have been drinking alcohol. Do not ride with someone who has been drinking. When you are tired or distracted. While texting. If you have been using any mind-altering substances or drugs. Wear a helmet and other protective equipment during sports activities. If you have firearms in your house, make sure you follow all gun safety procedures. What's next? Go to your health care provider once a year for an annual wellness visit. Ask your health care provider how often you should have your eyes and teeth checked. Stay up to date on all vaccines. This information is not intended to replace advice given to you by your health care provider. Make sure you discuss any questions you have with your health care provider. Document Revised: 12/20/2020 Document Reviewed: 12/20/2020 Elsevier Patient Education  2024 [employer]    Kidney Stones Kidney stones are rock-like masses that form inside of the kidneys. Kidneys are organs that make pee (urine). A kidney stone may move into other parts of the urinary tract, including: The tubes that connect the kidneys to the bladder (ureters). The bladder. The tube that carries urine out of the body (urethra). Kidney stones can cause very bad pain and can block the flow of pee. The stone usually leaves your body through your pee. A doctor may need to take out the stone. What are the causes? Kidney stones may be caused by: Too much calcium in the body. This may be caused by too much parathyroid hormone in the blood. Uric acid crystals in the bladder. The body makes uric acid when you eat certain foods. Narrowing of one or both of the ureters. A kidney blockage that you were born with. Past surgery on the kidney or the ureters. What increases the  risk? You are more likely to develop this condition if: You have had a kidney stone in the past. Other people in your family have had kidney stones. You do not drink enough water. You eat a diet that is high in protein, salt (sodium), or sugar. You are very overweight (obese). What are the signs or symptoms? Symptoms of a kidney stone may include: Pain in the side of the belly, right below the ribs. Pain usually spreads to the groin. Needing to pee often or right away. Pain when peeing. Blood in your pee. Feeling like you may vomit (nauseous). Vomiting. Fever and chills. How is this treated? Treatment depends on the size, location, and makeup of the kidney stones. The stones will often pass out of the body when you pee. You may need to: Drink more fluid to help pass the stone. In some cases, you may be given fluids through an IV tube at the hospital. Take medicine for pain. Change your diet to help keep kidney stones from coming back. Sometimes, you may need: A procedure to break up kidney stones using a beam of light (laser) or shock waves. Surgery to remove the kidney stones. Follow these instructions at home: Medicines Take over-the-counter and prescription medicines only as told by your doctor. Ask your doctor if the medicine prescribed to you requires  you to avoid driving or using machinery. Eating and drinking Drink enough fluid to keep your pee pale yellow. You may be told to drink at least 8-10 glasses of water each day. This will help you pass the stone. If told by your doctor, change your diet. You may be told to: Limit how much salt you eat. Eat more fruits and vegetables. Limit how much meat, poultry, fish, and eggs you eat. Follow instructions from your doctor about what you may eat and drink. General instructions Collect pee samples as told by your doctor. You may need to collect a pee sample: 24 hours after a stone comes out. 8-12 weeks after a stone comes out,  and every 6-12 months after that. Strain your pee every time you pee. Use the strainer that your doctor recommends. Do not throw out the stone. Keep it so that it can be tested by your doctor. Keep all follow-up visits. You may need X-rays and ultrasounds to make sure the stone has come out. How is this prevented? To prevent another kidney stone: Drink enough fluid to keep your pee pale yellow. This is the best way to prevent kidney stones. Eat healthy foods. Avoid certain foods as told by your doctor. You may be told to eat less protein. Stay at a healthy weight. Where to find more information National Kid[employer] (NKF): kidney.org [facility]): urologyhealth.org Contact a doctor if: You have pain that gets worse or does not get better with medicine. Get help right away if: You have a fever or chills. You get very bad pain. You get new pain in your belly. You faint. You cannot pee. This information is not intended to replace advice given to you by your health care provider. Make sure you discuss any questions you have with your health care provider. Document Revised: 02/15/2022 Document Reviewed: 02/15/2022 Elsevier Patient Education  2024 [employer].

## 2024-05-15 ENCOUNTER — Ambulatory Visit: Payer: Self-pay | Admitting: Medical

## 2024-07-27 ENCOUNTER — Other Ambulatory Visit: Payer: Self-pay | Admitting: Gastroenterology

## 2024-07-28 ENCOUNTER — Telehealth: Payer: Self-pay | Admitting: Gastroenterology

## 2024-07-28 ENCOUNTER — Encounter: Payer: Self-pay | Admitting: Medical

## 2024-07-28 MED ORDER — CHOLESTYRAMINE 4 G PO PACK: PACK | ORAL | 1 refills | Status: AC

## 2024-07-28 NOTE — Telephone Encounter (Signed)
 Prescription sent to patient's pharmacy until scheduled appt.

## 2024-07-28 NOTE — Telephone Encounter (Signed)
 Has scheduled a follow up appointment but the first available is not until 08-27-24. Wonders if we can provide one more refill to hold him over until upcoming appointment please.

## 2024-08-27 ENCOUNTER — Ambulatory Visit: Admitting: Gastroenterology
